# Patient Record
Sex: Male | Born: 1980 | Race: White | Hispanic: No | Marital: Married | Smoking: Former smoker
Health system: Southern US, Community
[De-identification: ages and names within clinical notes are randomized; demographics above are authoritative.]

## PROBLEM LIST (undated history)

## (undated) DIAGNOSIS — M545 Low back pain, unspecified: Secondary | ICD-10-CM

## (undated) DIAGNOSIS — T07XXXA Unspecified multiple injuries, initial encounter: Secondary | ICD-10-CM

## (undated) DIAGNOSIS — Z8489 Family history of other specified conditions: Secondary | ICD-10-CM

## (undated) DIAGNOSIS — Z98811 Dental restoration status: Secondary | ICD-10-CM

## (undated) DIAGNOSIS — N939 Abnormal uterine and vaginal bleeding, unspecified: Secondary | ICD-10-CM

## (undated) DIAGNOSIS — J45909 Unspecified asthma, uncomplicated: Secondary | ICD-10-CM

## (undated) DIAGNOSIS — F419 Anxiety disorder, unspecified: Secondary | ICD-10-CM

## (undated) DIAGNOSIS — G4730 Sleep apnea, unspecified: Secondary | ICD-10-CM

## (undated) DIAGNOSIS — G43909 Migraine, unspecified, not intractable, without status migrainosus: Secondary | ICD-10-CM

## (undated) DIAGNOSIS — G8929 Other chronic pain: Secondary | ICD-10-CM

## (undated) DIAGNOSIS — R55 Syncope and collapse: Secondary | ICD-10-CM

## (undated) DIAGNOSIS — S52501A Unspecified fracture of the lower end of right radius, initial encounter for closed fracture: Secondary | ICD-10-CM

## (undated) HISTORY — PX: TONSILLECTOMY: SHX5217

## (undated) HISTORY — DX: Low back pain, unspecified: M54.50

## (undated) HISTORY — PX: TONSILLECTOMY: SUR1361

## (undated) HISTORY — DX: Syncope and collapse: R55

## (undated) HISTORY — DX: Anxiety disorder, unspecified: F41.9

## (undated) HISTORY — DX: Unspecified asthma, uncomplicated: J45.909

## (undated) HISTORY — DX: Other chronic pain: G89.29

---

## 2008-12-11 ENCOUNTER — Other Ambulatory Visit: Admission: RE | Admit: 2008-12-11 | Discharge: 2008-12-11 | Payer: Self-pay | Admitting: Family Medicine

## 2009-04-21 ENCOUNTER — Encounter (INDEPENDENT_AMBULATORY_CARE_PROVIDER_SITE_OTHER): Payer: Self-pay | Admitting: General Surgery

## 2009-04-21 ENCOUNTER — Inpatient Hospital Stay (HOSPITAL_COMMUNITY): Admission: EM | Admit: 2009-04-21 | Discharge: 2009-04-22 | Payer: Self-pay | Admitting: Emergency Medicine

## 2009-04-21 HISTORY — PX: LAPAROSCOPIC APPENDECTOMY: SHX408

## 2010-08-18 ENCOUNTER — Emergency Department (HOSPITAL_COMMUNITY): Admission: EM | Admit: 2010-08-18 | Discharge: 2010-08-18 | Payer: Self-pay | Admitting: Emergency Medicine

## 2011-02-27 LAB — URINALYSIS, ROUTINE W REFLEX MICROSCOPIC
Glucose, UA: NEGATIVE mg/dL
Hgb urine dipstick: NEGATIVE
Ketones, ur: NEGATIVE mg/dL
Protein, ur: NEGATIVE mg/dL
Urobilinogen, UA: 0.2 mg/dL (ref 0.0–1.0)

## 2011-02-27 LAB — DIFFERENTIAL
Basophils Absolute: 0 10*3/uL (ref 0.0–0.1)
Basophils Relative: 0 % (ref 0–1)
Eosinophils Absolute: 0.2 10*3/uL (ref 0.0–0.7)
Monocytes Absolute: 0.5 10*3/uL (ref 0.1–1.0)
Monocytes Relative: 3 % (ref 3–12)
Neutrophils Relative %: 86 % — ABNORMAL HIGH (ref 43–77)

## 2011-02-27 LAB — CBC
HCT: 40.2 % (ref 36.0–46.0)
Hemoglobin: 13.3 g/dL (ref 12.0–15.0)
RBC: 4.76 MIL/uL (ref 3.87–5.11)
RDW: 12.3 % (ref 11.5–15.5)
WBC: 14.5 10*3/uL — ABNORMAL HIGH (ref 4.0–10.5)

## 2011-02-27 LAB — COMPREHENSIVE METABOLIC PANEL
ALT: 23 U/L (ref 0–35)
Alkaline Phosphatase: 71 U/L (ref 39–117)
BUN: 6 mg/dL (ref 6–23)
Chloride: 102 mEq/L (ref 96–112)
Glucose, Bld: 97 mg/dL (ref 70–99)
Potassium: 3.9 mEq/L (ref 3.5–5.1)
Sodium: 137 mEq/L (ref 135–145)
Total Bilirubin: 0.6 mg/dL (ref 0.3–1.2)
Total Protein: 7.1 g/dL (ref 6.0–8.3)

## 2011-02-27 LAB — POCT PREGNANCY, URINE: Preg Test, Ur: NEGATIVE

## 2011-04-04 NOTE — H&P (Signed)
[name], [name] NO.:  [id number]   MEDICAL RECORD NO.:  [id number]          PATIENT TYPE:  INP   LOCATION:  0098                         FACILITY:  [facility]   PHYSICIAN:  [name], M.D.DATE OF BIRTH:  04-26-1981   DATE OF ADMISSION:  04/20/2009  DATE OF DISCHARGE:                              HISTORY & PHYSICAL   CHIEF COMPLAINT:  Right lower quadrant pain and nausea.   HISTORY OF PRESENT ILLNESS:  This is a 30 year old Caucasian male who  states that for the past 2 weeks she has had mild intermittent episodes  right lower quadrant pain and nausea.  This has been clearly  intermittent and she felt normal in between.  She has seen no blood in  her stools.  She has had no fever or chills.  Starting at noon  yesterday, the pain in the right lower quadrant became steady and severe  and has been associated with nausea.  She has never had a pain like this  before.  She denies voiding problems.  She denies any history of  gastrointestinal disorders, although she states that several years ago  she had a CT scan in [place] and was told she had  diverticulitis.  She has had no followup.   She came to the emergency department here.  CT scan shows early  appendicitis in that the appendix is seen and is thought to be a little  bit swollen and inflamed.  There is no sign of abscess, rupture or other  focal abnormality.  I was called at that point.   PAST HISTORY:  1. Mild asthma.  2. Tonsillectomy in the past.  3. Migraine headaches.  4. Question of diverticulitis on CT by history several years ago.   CURRENT MEDICATIONS:  Albuterol inhaler rarely.  Midrin.  Yaz.   ALLERGIES:  Z-PAK and LATEX.   SOCIAL HISTORY:  She is single.  She has a partner, who is here with  her.  She smokes a half-pack of cigarettes per day.  Drinks alcohol  occasionally, perhaps once or twice weekly.  She works as a International aid/development worker for the [employer]   FAMILY HISTORY:  Mother living and well.  Father living, has had a  stroke and has hyperlipidemia.   REVIEW OF SYSTEMS:  A 10-system review of systems is performed and is  noncontributory except as described above.   PHYSICAL EXAMINATION:  A pleasant, cooperative, overweight Caucasian  male in mild distress.  Temperature 98.1, pulse 87, respirations 21, blood pressure 124/69.  Eyes:  Sclerae are clear.  Extraocular movements intact.  Ears, nose,  mouth, throat, lips, tongue and oropharynx are without gross lesions.  NECK:  Supple, nontender.  No mass.  No jugular venous distention.  LUNGS:  Clear to auscultation.  No chest wall tenderness.  HEART:  Regular rate and rhythm.  No murmur.  Radial and femoral pulses  are palpable.  BREASTS:  Not examined.  ABDOMEN:  Somewhat obese, soft, tender with involuntary guarding in the  right lower quadrant.  She has mild left lower quadrant tenderness and a  little bit of referred rebound as well.  There is no mass.  There is no  hernia.  Liver and spleen are not enlarged.  I do not see any scars.  GENITOURINARY:  There is no inguinal hernia or mass.  EXTREMITIES:  She moves all 4 extremities well without pain or  deformity.  NEUROLOGIC:  No gross motor or sensory deficits.   ADMISSION DATA:  CT scan suggesting early appendicitis described above.  Hemoglobin 13.3, white blood cell count 14,500.  Urine pregnancy test  negative.  Complete metabolic panel normal.   IMPRESSION:  1. Acute appendicitis.  No evidence of rupture or abscess.  2. Obesity.  3. Mild asthma.   PLAN:  1. The patient will be started on IV antibiotics, IV fluid      resuscitation, and will be taken to operating room for a diagnostic      laparoscopy and appendectomy.  2. I have discussed the indication and details of surgery with the      patient and her partner.  Risks and complications have been      outlined, including but not limited to bleeding,  infection,      conversion to open laparotomy, alternative diagnosis such as Crohn      disease or diverticulitis or gynecologic problems, injury to      adjacent organs such as the intestine or bladder with major      reconstructive surgery, wound problems, cardiac, pulmonary and      thromboembolic problems.  She seems to understand all these issues      well.  At this time all of their questions are answered.  She is in      full agreement this plan.      [name], M.D.  Electronically Signed     HMI/MEDQ  D:  04/21/2009  T:  04/21/2009  Job:  [id number]   cc:   Deboraha Sprang at [facility]

## 2011-04-04 NOTE — Op Note (Signed)
NAMEHANAN, MOEN NO.:  [id number]   MEDICAL RECORD NO.:  [id number]          PATIENT TYPE:  INP   LOCATION:  0098                         FACILITY:  [facility]   PHYSICIAN:  Angelia Mould. Derrell Lolling, M.D.DATE OF BIRTH:  1981-05-20   DATE OF PROCEDURE:  04/21/2009  DATE OF DISCHARGE:                               OPERATIVE REPORT   PREOPERATIVE DIAGNOSIS:  Acute appendicitis.   POSTOPERATIVE DIAGNOSIS:  Acute appendicitis.   OPERATION PERFORMED:  Laparoscopic appendectomy.   SURGEON:  Dr. Claud Kelp.   OPERATIVE INDICATIONS:  This is a 30 year old overweight Caucasian  male who presents with a 2-week history of mild to intermittent right  lower quadrant pain and diarrhea.  This has been not very dramatic.  She  notes for the past 24 hours or less more severe steady, sharp, right  lower quadrant pain and nausea.  She came to the emergency room.  Blood  work showed white blood cell count 14,500.  Physical exam shows  localized tenderness and guarding in the right lower quadrant and some  referred rebound the right lower quadrant.  A CT scan shows early  appendicitis and no other abnormalities.  She is brought to operating  room emergently.   OPERATIVE FINDINGS:  The appendix was acutely inflamed but there was no  exudate there was no gangrene.  There was no evidence of rupture.  The  last 3 feet of terminal ileum looked normal.  The right colon looked  normal.  The liver looked normal.  No other abnormalities were noted.   OPERATIVE TECHNIQUE:  Following induction of general endotracheal  anesthesia, a Foley catheter was inserted.  The patient's abdomen was  prepped and draped in sterile fashion.  Intravenous antibiotics were  given.  Surgical time-out was held.  The patient was identified as  correct patient and correct procedure.  0.5% Marcaine with epinephrine  was used as a local infiltration anesthetic.  A vertically oriented  incision was made just above  the umbilicus.  The fascia was incised in  the midline.  The abdominal cavity entered under direct vision.  An 11-  mm Hassan trocar was inserted, secured the pursestring suture of 0  Vicryl.  Pneumoperitoneum was created.  Video cam was inserted with  visualization and findings as described above.  A 5-mm trocar was placed  in the left lower mid abdomen and a 12-mm trocar was placed in the  suprapubic area just to the left in the midline.  We positioned the  patient and mobilized the small bowel up out of the pelvis.  We  identified the anatomy of the ileocecal valve, the appendix and cecum.  We elevated the appendix with a grasper.  Using the Harmonic scalpel.  We divided and took down the appendiceal mesentery and the appendiceal  artery.  We did this in several small steps until we had completely  skeletonized the appendix and could clearly see the junction of the  appendix with the cecum.  An Endo-GIA stapler was inserted and placed  transversely across the base of the appendix at the level of the cecum.  Stapler  was closed, held in place for 30 seconds, fired and removed.  Staple line looked very good, no bleeding, very secure closure.  The  appendix was placed in the specimen bag and removed.  The operative  field was irrigated.  There was no real bleeding or exudate.  We removed  the trocars.  There was no bleeding from trocar sites.  The  pneumoperitoneum was released.  The fascia at the umbilicus was closed  with 0 Vicryl sutures.  All the incisions were irrigated with saline and  all three skin incisions closed with subcuticular sutures of 4-0  Monocryl and Steri-Strips.  Clean bandages were placed and the patient  taken to recovery room in stable condition.  Estimated blood loss was  about 10 mL.   COMPLICATIONS:  None.   Sponge, needle and instrument counts were correct.      Angelia Mould. Derrell Lolling, M.D.  Electronically Signed     HMI/MEDQ  D:  04/21/2009  T:   04/21/2009  Job:  [id number]   cc:   Deboraha Sprang at [facility]

## 2012-04-25 ENCOUNTER — Ambulatory Visit (INDEPENDENT_AMBULATORY_CARE_PROVIDER_SITE_OTHER): Payer: BC Managed Care – PPO | Admitting: Surgery

## 2012-04-25 ENCOUNTER — Encounter (INDEPENDENT_AMBULATORY_CARE_PROVIDER_SITE_OTHER): Payer: Self-pay | Admitting: Surgery

## 2012-04-25 VITALS — BP 122/84 | HR 72 | Temp 98.3°F | Resp 14 | Ht 67.0 in | Wt 251.6 lb

## 2012-04-25 DIAGNOSIS — E669 Obesity, unspecified: Secondary | ICD-10-CM | POA: Insufficient documentation

## 2012-04-25 DIAGNOSIS — K8010 Calculus of gallbladder with chronic cholecystitis without obstruction: Secondary | ICD-10-CM | POA: Insufficient documentation

## 2012-04-25 NOTE — Progress Notes (Signed)
Subjective:     Patient ID: [name], male   DOB: Jul 11, 1981, 31 y.o.   MRN: [id number]  HPI  [name]  11-23-1980 [id number]  Patient Care Team: Leanor Rubenstein, MD as PCP - General (Family Medicine)  This patient is a 31 y.o.male who presents today for surgical evaluation at the request of Dr. Wynelle Link.   Reason for visit: Chest and upper abdominal pain. Gallstones. Question biliary etiology.  This pleasant obese male who has been struggling with intermittent abdominal pains for several months. He became more intense 2 months ago. Started having tightness and nausea. She has severe episode driving back from [place]. Unresponsive to inhalers. He usually associated after eating one or 2 hours later. Gets very queasy. Some bloating. Episodes are now happening almost every other day.  Moderate physical tolerance. Works as a [employer] fast streamer. He normally has daily bowel movements but they've become more loose. Has some chronic lactose intolerance but that is not changed. Denies heartburn or reflux. Can lie down flat/bend over without symptoms/difficulty. Some chronic pain issues but is stable.  She had workup which showed gallstones. Given the story, she was sent for evaluation for possibility of cholecystectomy  Patient Active Problem List  Diagnoses  . Chronic cholecystitis with calculus  . Obesity (BMI 30-39.9)    Past Medical History  Diagnosis Date  . Asthma   . Chronic cholecystitis with calculus 04/25/2012    Past Surgical History  Procedure Date  . Tonsillectomy   . Appendectomy 2010    History   Social History  . Marital Status: Single    Spouse Name: N/A    Number of Children: N/A  . Years of Education: N/A   Occupational History  . Not on file.   Social History Main Topics  . Smoking status: Former Smoker    Quit date: 10/20/2010  . Smokeless tobacco: Not on file  . Alcohol Use: Yes  . Drug Use: No  . Sexually Active: Not on file   Other  Topics Concern  . Not on file   Social History Narrative  . No narrative on file    Family History  Problem Relation Age of Onset  . Skin cancer      Current Outpatient Prescriptions  Medication Sig Dispense Refill  . albuterol (PROVENTIL) (2.5 MG/3ML) 0.083% nebulizer solution Take 2.5 mg by nebulization every 6 (six) hours as needed.      . BuPROPion HCl (WELLBUTRIN PO) Take 10 mg by mouth daily.      . cetirizine (ZYRTEC ALLERGY) 10 MG tablet Take 10 mg by mouth daily.      . promethazine (PHENERGAN) 12.5 MG tablet Take 12.5 mg by mouth every 6 (six) hours as needed.      . pseudoephedrine (SUDAFED) 120 MG 12 hr tablet Take 120 mg by mouth every 12 (twelve) hours.      . topiramate (TOPAMAX) 50 MG tablet Take 50 mg by mouth daily.         Allergies  Allergen Reactions  . Latex   . Zithromax (Azithromycin)     BP 122/84  Pulse 72  Temp 98.3 F (36.8 C)  Resp 14  Ht 5\' 7"  (1.702 m)  Wt 251 lb 9.6 oz (114.125 kg)  BMI 39.41 kg/m2  No results found.   Review of Systems  Constitutional: Negative for fever, chills, diaphoresis, appetite change and fatigue.  HENT: Negative for ear pain, sore throat, trouble swallowing, neck pain and ear discharge.  Eyes: Negative for photophobia, discharge and visual disturbance.  Respiratory: Negative for cough, choking, chest tightness and shortness of breath.   Cardiovascular: Negative for chest pain and palpitations.       Patient walks 30 minutes without difficulty.  No exertional chest/neck/shoulder/arm pain.   Gastrointestinal: Positive for nausea, abdominal pain and diarrhea. Negative for vomiting, constipation, anal bleeding and rectal pain.       No personal nor family history of GI/colon cancer, inflammatory bowel disease, irritable bowel syndrome, allergy such as Celiac Sprue, dietary problems except mild lactose intolerance, colitis, ulcers nor gastritis.    No recent sick contacts/gastroenteritis.  No travel outside the  country.  No changes in diet.   Genitourinary: Negative for dysuria, frequency and difficulty urinating.  Musculoskeletal: Negative for myalgias and gait problem.  Skin: Negative for color change, pallor and rash.  Neurological: Negative for dizziness, speech difficulty, weakness and numbness.  Hematological: Negative for adenopathy.  Psychiatric/Behavioral: Negative for confusion and agitation. The patient is not nervous/anxious.        Objective:   Physical Exam  Constitutional: She is oriented to person, place, and time. She appears well-developed and well-nourished. No distress.  HENT:  Head: Normocephalic.  Mouth/Throat: Oropharynx is clear and moist. No oropharyngeal exudate.  Eyes: Conjunctivae and EOM are normal. Pupils are equal, round, and reactive to light. No scleral icterus.  Neck: Normal range of motion. Neck supple. No tracheal deviation present.  Cardiovascular: Normal rate, regular rhythm and intact distal pulses.   Pulmonary/Chest: Effort normal and breath sounds normal. No respiratory distress. She exhibits no tenderness.  Abdominal: Soft. Bowel sounds are normal. She exhibits no distension, no ascites and no mass. There is tenderness in the right upper quadrant. There is no CVA tenderness. No hernia. Hernia confirmed negative in the right inguinal area and confirmed negative in the left inguinal area.       Obese  Genitourinary: No vaginal discharge found.  Musculoskeletal: Normal range of motion. She exhibits no tenderness.  Lymphadenopathy:    She has no cervical adenopathy.       Right: No inguinal adenopathy present.       Left: No inguinal adenopathy present.  Neurological: She is alert and oriented to person, place, and time. No cranial nerve deficit. She exhibits normal muscle tone. Coordination normal.  Skin: Skin is warm and dry. No rash noted. She is not diaphoretic. No erythema.  Psychiatric: She has a normal mood and affect. Her speech is normal and  behavior is normal. Judgment and thought content normal.       Pleasant, good sense of humor       Assessment:     Chronic cholecystitis    Plan:     I think she would benefit from lap chole.  OK to try single site  The anatomy & physiology of hepatobiliary & pancreatic function was discussed.  The pathophysiology of gallbladder dysfunction was discussed.  Natural history risks without surgery was discussed.   I feel the risks of no intervention will lead to serious problems that outweigh the operative risks; therefore, I recommended cholecystectomy to remove the pathology.  I explained laparoscopic techniques with possible need for an open approach.  Probable cholangiogram to evaluate the bilary tract was explained as well.    Risks such as bleeding, infection, abscess, leak, injury to other organs, need for further treatment, heart attack, death, and other risks were discussed.  I noted a good likelihood this will help address the problem.  Possibility that this will not correct all abdominal symptoms was explained.  Goals of post-operative recovery were discussed as well.  We will work to minimize complications.  An educational handout further explaining the pathology and treatment options was given as well.  Questions were answered.  The patient expresses understanding & wishes to proceed with surgery.

## 2012-04-25 NOTE — Patient Instructions (Signed)
See the Handout(s) we gave you.  Consider surgery.  Please call our office at [phone] if you wish to schedule surgery or if you have further questions / concerns.   Cholelithiasis Cholelithiasis (also called gallstones) is a form of gallbladder disease where gallstones form in your gallbladder. The gallbladder is a non-essential organ that stores bile made in the liver, which helps digest fats. Gallstones begin as small crystals and slowly grow into stones. Gallstone pain occurs when the gallbladder spasms, and a gallstone is blocking the duct. Pain can also occur when a stone passes out of the duct.  Women are more likely to develop gallstones than men. Other factors that increase the risk of gallbladder disease are:  Having multiple pregnancies. Physicians sometimes advise removing diseased gallbladders before future pregnancies.   Obesity.   Diets heavy in fried foods and fat.   Increasing age (older than 60).   Prolonged use of medications containing male hormones.   Diabetes mellitus.   Rapid weight loss.   Family history of gallstones (heredity).  SYMPTOMS  Feeling sick to your stomach (nauseous).   Abdominal pain.   Yellowing of the skin (jaundice).   Sudden pain. It may persist from several minutes to several hours.   Worsening pain with deep breathing or when jarred.   Fever.   Tenderness to the touch.  In some cases, when gallstones do not move into the bile duct, people have no pain or symptoms. These are called "silent" gallstones. TREATMENT In severe cases, emergency surgery may be required. HOME CARE INSTRUCTIONS   Only take over-the-counter or prescription medicines for pain, discomfort, or fever as directed by your caregiver.   Follow a low-fat diet until seen again. Fat causes the gallbladder to contract, which can result in pain.   Follow up as instructed. Attacks are almost always recurrent and surgery is usually required for permanent  treatment.  SEEK IMMEDIATE MEDICAL CARE IF:   Your pain increases and is not controlled by medications.   You have an oral temperature above 102 F (38.9 C), not controlled by medication.   You develop nausea and vomiting.  MAKE SURE YOU:   Understand these instructions.   Will watch your condition.   Will get help right away if you are not doing well or get worse.  Document Released: 11/02/2005 Document Revised: 10/26/2011 Document Reviewed: 01/05/2011 [employer] Patient Information 2012 [employer]. 

## 2012-06-04 ENCOUNTER — Encounter (INDEPENDENT_AMBULATORY_CARE_PROVIDER_SITE_OTHER): Payer: Self-pay

## 2012-06-07 ENCOUNTER — Encounter (INDEPENDENT_AMBULATORY_CARE_PROVIDER_SITE_OTHER): Payer: Self-pay

## 2012-06-18 ENCOUNTER — Other Ambulatory Visit (INDEPENDENT_AMBULATORY_CARE_PROVIDER_SITE_OTHER): Payer: Self-pay | Admitting: Surgery

## 2012-06-18 ENCOUNTER — Telehealth (INDEPENDENT_AMBULATORY_CARE_PROVIDER_SITE_OTHER): Payer: Self-pay

## 2012-06-18 DIAGNOSIS — K8010 Calculus of gallbladder with chronic cholecystitis without obstruction: Secondary | ICD-10-CM

## 2012-06-18 HISTORY — PX: CHOLECYSTECTOMY: SHX55

## 2012-06-18 NOTE — Telephone Encounter (Signed)
LMOM for pt to call me back b/c I need to work her into see Dr Michaell Cowing on his clinic for 7/31 b/c we are canceling Dr Gordy Savers office for Monday the 8/5.

## 2012-06-24 ENCOUNTER — Encounter (INDEPENDENT_AMBULATORY_CARE_PROVIDER_SITE_OTHER): Payer: BC Managed Care – PPO | Admitting: Surgery

## 2012-06-26 ENCOUNTER — Encounter (INDEPENDENT_AMBULATORY_CARE_PROVIDER_SITE_OTHER): Payer: BC Managed Care – PPO | Admitting: Surgery

## 2012-07-01 ENCOUNTER — Ambulatory Visit (INDEPENDENT_AMBULATORY_CARE_PROVIDER_SITE_OTHER): Payer: BC Managed Care – PPO | Admitting: Surgery

## 2012-07-01 ENCOUNTER — Encounter (INDEPENDENT_AMBULATORY_CARE_PROVIDER_SITE_OTHER): Payer: Self-pay | Admitting: Surgery

## 2012-07-01 VITALS — BP 120/78 | HR 70 | Temp 97.3°F | Resp 14 | Ht 67.0 in | Wt 248.2 lb

## 2012-07-01 DIAGNOSIS — K8010 Calculus of gallbladder with chronic cholecystitis without obstruction: Secondary | ICD-10-CM

## 2012-07-01 NOTE — Patient Instructions (Signed)

## 2012-07-01 NOTE — Progress Notes (Signed)
Subjective:     Patient ID: [name], male   DOB: 10/11/1981, 31 y.o.   MRN: [id number]  HPI  [name]  11/26/1980 [id number]  Patient Care Team: Leanor Rubenstein, MD as PCP - General (Family Medicine)  This patient is a 31 y.o.male who presents today for surgical evaluation.   Procedure: Single site laparoscopic cholecystectomy 06/18/2012  Pathology: Chronic cholecystitis  The patient comes in today feeling better.  Off narcotics.  Some early satiety but tolerating most foods.  No diarrhea.  Energy level coming back.  Wanting to get back to work.  Doing moderate activity all ready.  No fevers or chills.  No nausea or vomiting daily bowel movements no severe diarrhea  Patient Active Problem List  Diagnosis  . Chronic cholecystitis with calculus  . Obesity (BMI 30-39.9)    Past Medical History  Diagnosis Date  . Asthma   . Chronic cholecystitis with calculus 04/25/2012    Past Surgical History  Procedure Date  . Tonsillectomy   . Appendectomy 2010  . Cholecystectomy 06/18/12    History   Social History  . Marital Status: Single    Spouse Name: N/A    Number of Children: N/A  . Years of Education: N/A   Occupational History  . Not on file.   Social History Main Topics  . Smoking status: Former Smoker    Quit date: 10/20/2010  . Smokeless tobacco: Not on file  . Alcohol Use: Yes  . Drug Use: No  . Sexually Active: Not on file   Other Topics Concern  . Not on file   Social History Narrative  . No narrative on file    Family History  Problem Relation Age of Onset  . Skin cancer      Current Outpatient Prescriptions  Medication Sig Dispense Refill  . albuterol (PROVENTIL) (2.5 MG/3ML) 0.083% nebulizer solution Take 2.5 mg by nebulization every 6 (six) hours as needed.      . BuPROPion HCl (WELLBUTRIN PO) Take 10 mg by mouth daily.      . cetirizine (ZYRTEC ALLERGY) 10 MG tablet Take 10 mg by mouth daily.      . promethazine  (PHENERGAN) 12.5 MG tablet Take 12.5 mg by mouth every 6 (six) hours as needed.      . pseudoephedrine (SUDAFED) 120 MG 12 hr tablet Take 120 mg by mouth every 12 (twelve) hours.      . topiramate (TOPAMAX) 50 MG tablet Take 50 mg by mouth daily.         Allergies  Allergen Reactions  . Latex   . Zithromax (Azithromycin)     BP 120/78  Pulse 70  Temp 97.3 F (36.3 C) (Temporal)  Resp 14  Ht 5\' 7"  (1.702 m)  Wt 248 lb 4 oz (112.605 kg)  BMI 38.88 kg/m2  No results found.   Review of Systems  Constitutional: Negative for fever, chills and diaphoresis.  HENT: Negative for ear pain, sore throat and trouble swallowing.   Eyes: Negative for photophobia and visual disturbance.  Respiratory: Negative for cough and choking.   Cardiovascular: Negative for chest pain and palpitations.  Gastrointestinal: Negative for nausea, vomiting, abdominal pain, diarrhea, constipation, anal bleeding and rectal pain.  Genitourinary: Negative for dysuria, frequency and difficulty urinating.  Musculoskeletal: Negative for myalgias and gait problem.  Skin: Negative for color change, pallor and rash.  Neurological: Negative for dizziness, speech difficulty, weakness and numbness.  Hematological: Negative for adenopathy.  Psychiatric/Behavioral:  Negative for confusion and agitation. The patient is not nervous/anxious.        Objective:   Physical Exam  Constitutional: She is oriented to person, place, and time. She appears well-developed and well-nourished. No distress.  HENT:  Head: Normocephalic.  Mouth/Throat: Oropharynx is clear and moist. No oropharyngeal exudate.  Eyes: Conjunctivae and EOM are normal. Pupils are equal, round, and reactive to light. No scleral icterus.  Neck: Normal range of motion. Neck supple. No tracheal deviation present.  Cardiovascular: Normal rate, regular rhythm and intact distal pulses.   Pulmonary/Chest: Effort normal and breath sounds normal. No respiratory  distress. She exhibits no tenderness.  Abdominal: Soft. She exhibits no distension and no mass. There is no tenderness. Hernia confirmed negative in the right inguinal area and confirmed negative in the left inguinal area.       Incisions clean with normal healing ridges. Mild separation of skin on right side at the most.   No hernias  Genitourinary: No vaginal discharge found.  Musculoskeletal: Normal range of motion. She exhibits no tenderness.  Lymphadenopathy:    She has no cervical adenopathy.       Right: No inguinal adenopathy present.       Left: No inguinal adenopathy present.  Neurological: She is alert and oriented to person, place, and time. No cranial nerve deficit. She exhibits normal muscle tone. Coordination normal.  Skin: Skin is warm and dry. No rash noted. She is not diaphoretic. No erythema.  Psychiatric: She has a normal mood and affect. Her behavior is normal. Judgment and thought content normal.       Assessment:     Recovering well only 2 weeks out from cholecystectomy for chronic cholecystitis    Plan:     Increase activity as tolerated.  Do not push through pain.  OK to go back to activity and then transition to unrestrained over the next week or so.  Forms filled out  Advance diet as tolerated. Bowel regimen to avoid problems.  Return to clinic p.r.n. The patient expressed understanding and appreciation

## 2014-11-17 ENCOUNTER — Encounter: Payer: Self-pay | Admitting: Skilled Nursing Facility1

## 2014-11-17 ENCOUNTER — Encounter: Payer: BC Managed Care – PPO | Attending: Family Medicine | Admitting: Skilled Nursing Facility1

## 2014-11-17 VITALS — Ht 67.0 in | Wt 277.0 lb

## 2014-11-17 DIAGNOSIS — Z6841 Body Mass Index (BMI) 40.0 and over, adult: Secondary | ICD-10-CM | POA: Diagnosis not present

## 2014-11-17 DIAGNOSIS — E669 Obesity, unspecified: Secondary | ICD-10-CM | POA: Insufficient documentation

## 2014-11-17 DIAGNOSIS — Z713 Dietary counseling and surveillance: Secondary | ICD-10-CM | POA: Diagnosis not present

## 2014-11-17 NOTE — Progress Notes (Signed)
  Medical Nutrition Therapy:  Appt start time: 11:15 end time:  12:15.   Assessment:  Primary concerns today: referred for obesity. Patient states she needs to change what and how she eats. She states that with a full time work schedule and a fulltime school schedule she has a hectic schedule which does not allow for exercise so she wants to lose weight without exercise. She came is worried the dietitian would "be a food nazis such as that of her wife". She did not want a restrictive diet. She states she is 100 pounds over where needs to be and feels her knee and back issues are due to excess weight. She reports her weight at 140 pounds in high school, gained weight in college and then out of college she was 280 pounds. When she was working in [employer] jail she got back to 180 pounds she left that job in 2008. 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 She finds her sleep to be sperotic and usually gets sleep an average of three hours a night. Her wife states the patient is a slow eater and she eats in her recliner at home in front of Television. The patient only has three classes left and will finish her education in mid august. She states her family is overweight on both sides of family. She seemed agitated/stressed as the appointment continued but she was still responsive to the dietitian. Her spouse seemed supportive but also hard on her wife when it came to her diet.      Preferred Learning Style:   Auditory  Visual  Learning Readiness:  Ready  MEDICATIONS: See List   DIETARY INTAKE:  Usual eating pattern includes 3 meals and none snacks per day.  Everyday foods include fast food.  Avoided foods include none stated.    24-hr recall:  B ( AM): cereal-raisin nut bran---none  Snk ( AM): none L ( PM): manhattan pizza and subs-sub with french fries Snk ( PM): none D ( PM): oranges and crackers-----fast food Snk ( PM):  none Beverages: soda, sweet tea with alternative sugar, water with flavoring  Usual physical activity: ADL's  Estimated energy needs: 1800 calories 200 g carbohydrates 135 g protein 50 g fat  Progress Towards Goal(s):  In progress.   Nutritional Diagnosis:  Willey-3.3 Overweight/obesity As related to overconsumption of fast food meals.  As evidenced by patient report.    Intervention:  Nutrition counseling for obesity. Dietitian educated the patient on the importance of eating more meals from home, proper portion sizes, and changing her relationship with food.  _Motivations to keep in mind and Barriers to keep in mind -Let your spouse help you plan out your meals   Goals:  Eat 3 meals/day, Avoid meal skipping   Increase protein rich foods  Follow "Plate Method" for portion control  Limit carbohydrate1-2 servings/meal   Choose more whole grains, lean protein, low-fat dairy, and fruits/non-starchy vegetables.   Aim for >30 min of physical activity daily  Limit sugar-sweetened beverages and concentrated sweets  Aim for 7 hours of sleep every night  Take study breaks  Teaching Method Utilized:  Visual Auditory  Handouts given during visit include:  Low sodium flavoring  My Plate  Barriers to learning/adherence to lifestyle change: a busy schedule.   Demonstrated degree of understanding via:  Teach Back   Monitoring/Evaluation:  Dietary intake and body weight prn.

## 2014-11-17 NOTE — Patient Instructions (Signed)
_Motivations to keep in mind and Barriers to keep in mind -Let your spouse help you plan out your meals   Goals:  Eat 3 meals/day, Avoid meal skipping   Increase protein rich foods  Follow "Plate Method" for portion control  Limit carbohydrate1-2 servings/meal   Choose more whole grains, lean protein, low-fat dairy, and fruits/non-starchy vegetables.   Aim for >30 min of physical activity daily  Limit sugar-sweetened beverages and concentrated sweets  Aim for 7 hours of sleep every night  Take study breaks

## 2015-02-19 ENCOUNTER — Ambulatory Visit: Payer: BC Managed Care – PPO | Admitting: Skilled Nursing Facility1

## 2015-11-21 HISTORY — PX: FOOT SURGERY: SHX648

## 2016-10-26 ENCOUNTER — Other Ambulatory Visit: Payer: Self-pay | Admitting: Family Medicine

## 2016-10-26 DIAGNOSIS — G43909 Migraine, unspecified, not intractable, without status migrainosus: Secondary | ICD-10-CM

## 2016-10-26 DIAGNOSIS — H538 Other visual disturbances: Secondary | ICD-10-CM

## 2016-11-03 ENCOUNTER — Ambulatory Visit
Admission: RE | Admit: 2016-11-03 | Discharge: 2016-11-03 | Disposition: A | Discharge status: Home / Self Care | Payer: BC Managed Care – PPO | Source: Ambulatory Visit | Attending: Family Medicine | Admitting: Family Medicine

## 2016-11-03 DIAGNOSIS — G43909 Migraine, unspecified, not intractable, without status migrainosus: Secondary | ICD-10-CM

## 2016-11-03 DIAGNOSIS — H538 Other visual disturbances: Secondary | ICD-10-CM

## 2017-04-12 IMAGING — CT CT HEAD W/O CM
3 series · 16 of 47 positions shown, 19 images · non-contrast
Comparison: None.

CLINICAL DATA: Blurry vision for 2 months

EXAM:
CT HEAD WITHOUT CONTRAST
TECHNIQUE: Contiguous axial images were obtained from the base of the skull
through the vertex without intravenous contrast.

[Series 2: head w/(date) · axial · 0.45mm/px · z∈[+23,+148]mm · 10 of 30 slices shown, 13 images]
[im 3/30  brain]
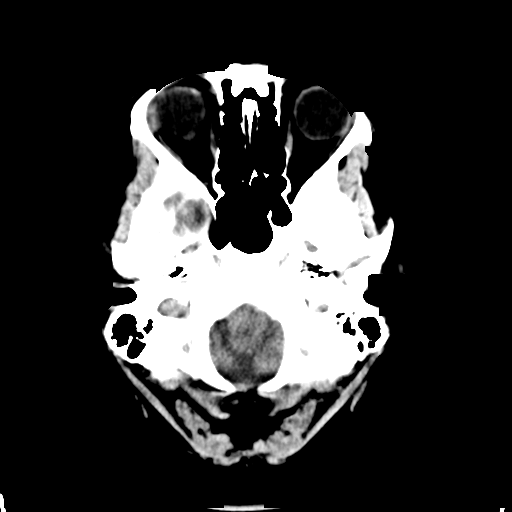
[im 3/30  bone]
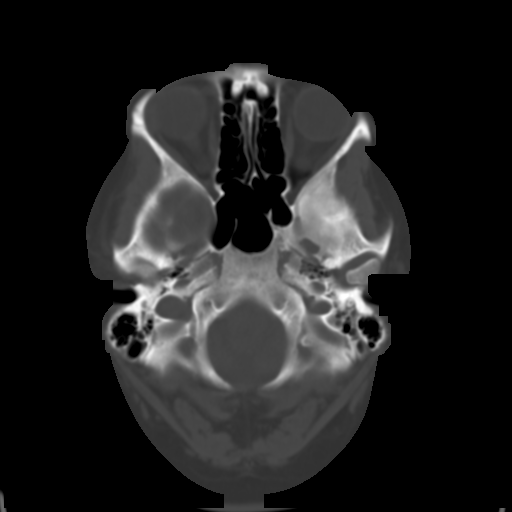
[im 6/30  brain]
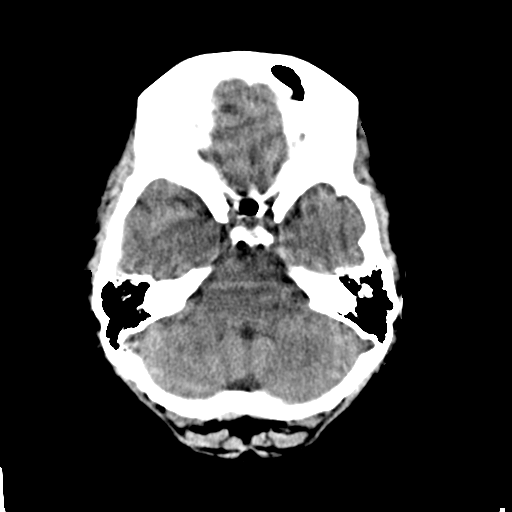
[im 9/30  brain]
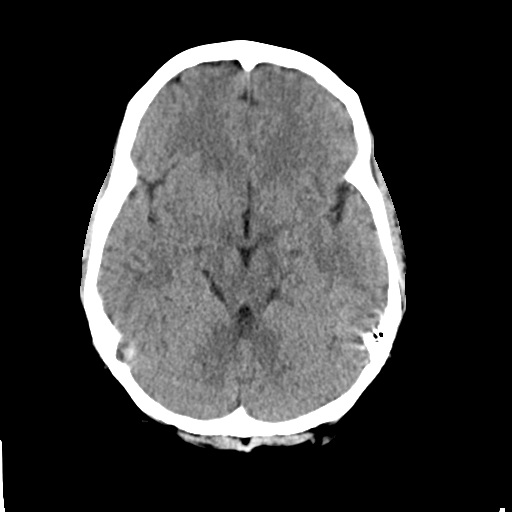
[im 11/30  brain]
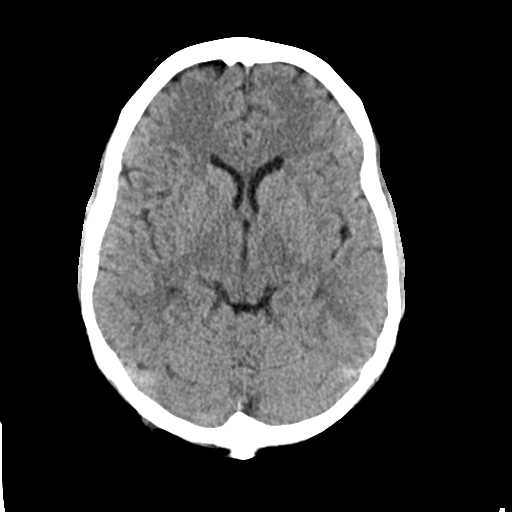
[im 14/30  brain]
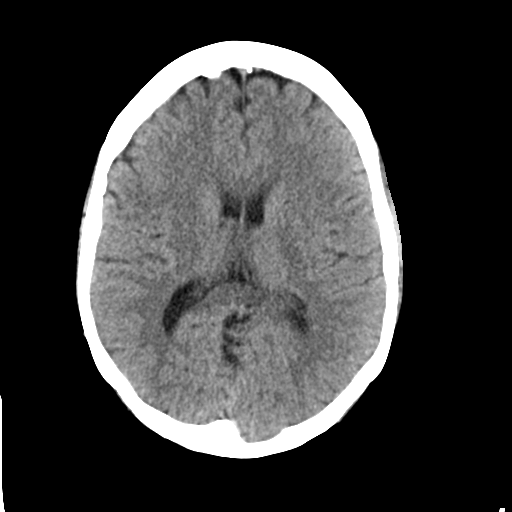
[im 14/30  bone]
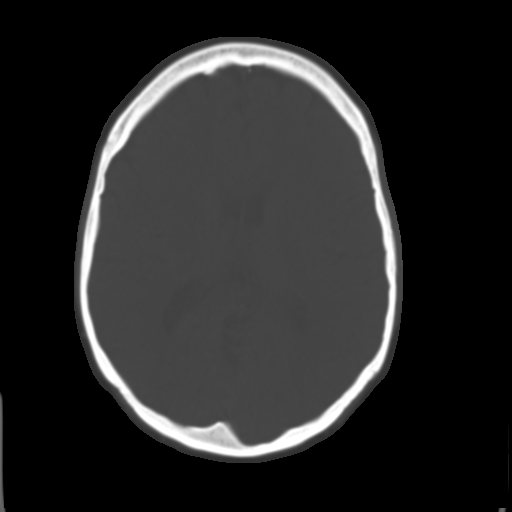
[im 17/30  brain]
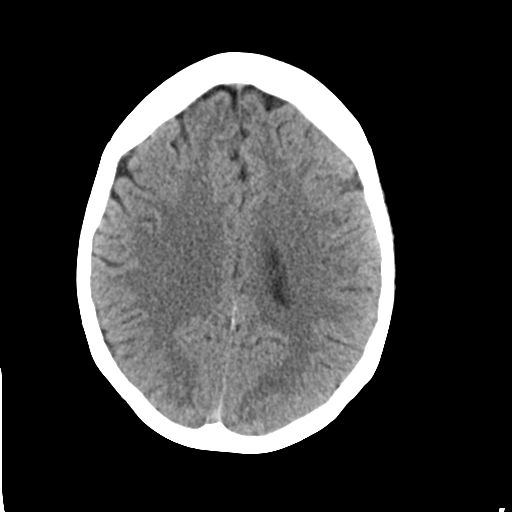
[im 20/30  brain]
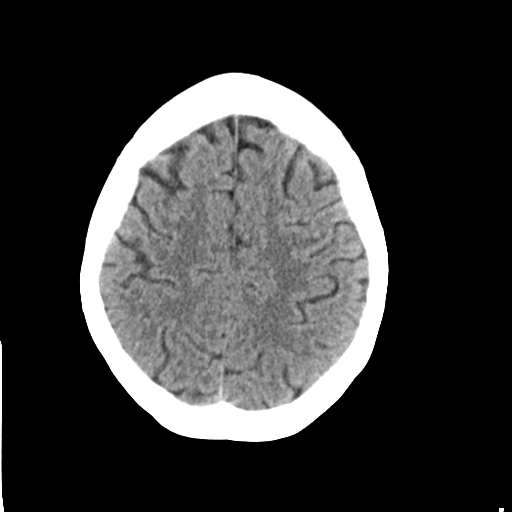
[im 23/30  brain]
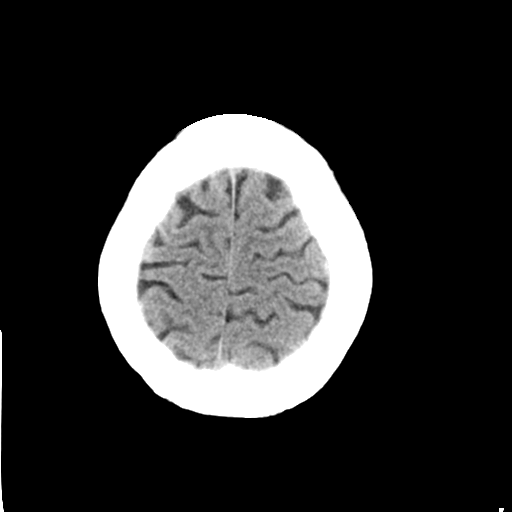
[im 25/30  brain]
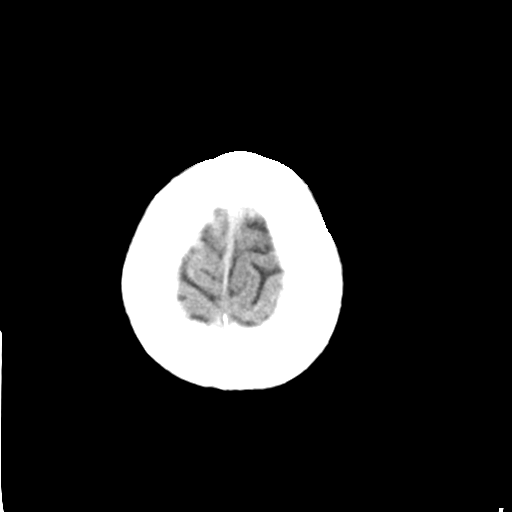
[im 25/30  bone]
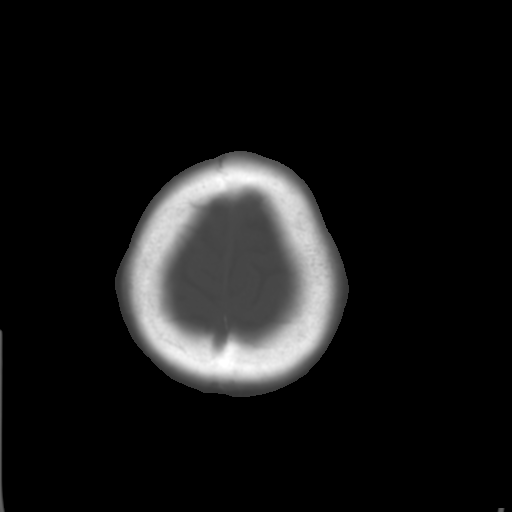
[im 28/30  brain]
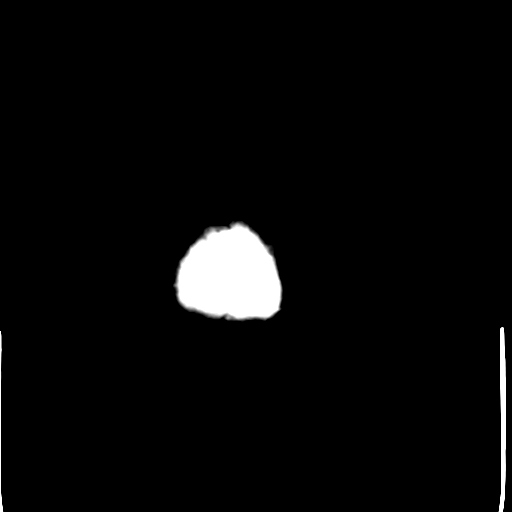

[Series 4: cor · coronal · 0.29mm/px · 3 of 65 slices shown]
[im 22/65  brain]
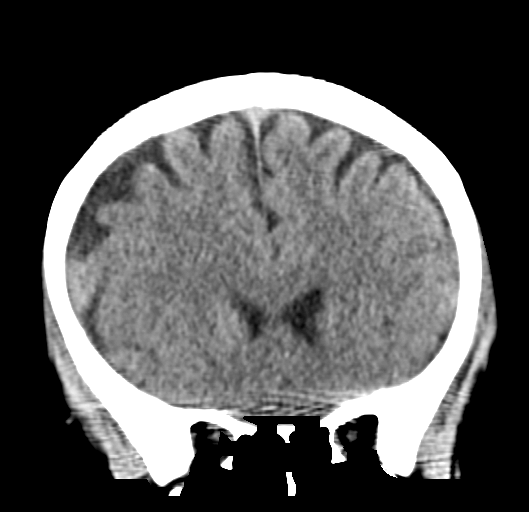
[im 29/65  brain]
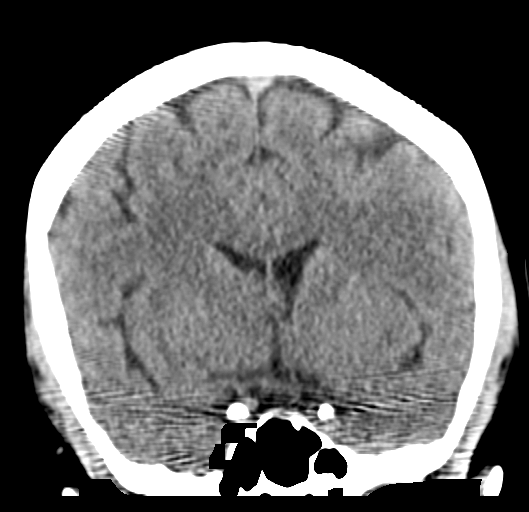
[im 36/65  brain]
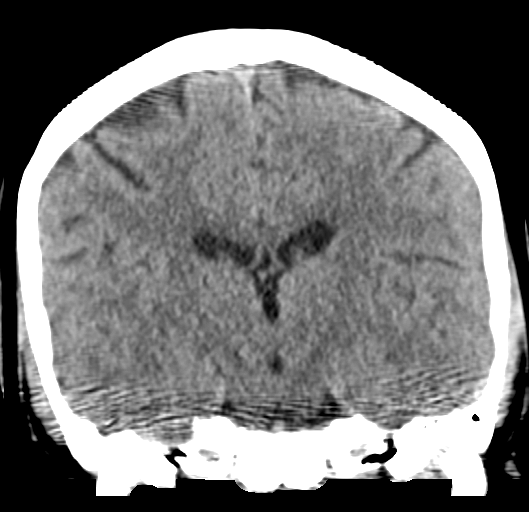

[Series 5: sag · sagittal · 0.29mm/px · 3 of 54 slices shown]
[im 18/54  brain]
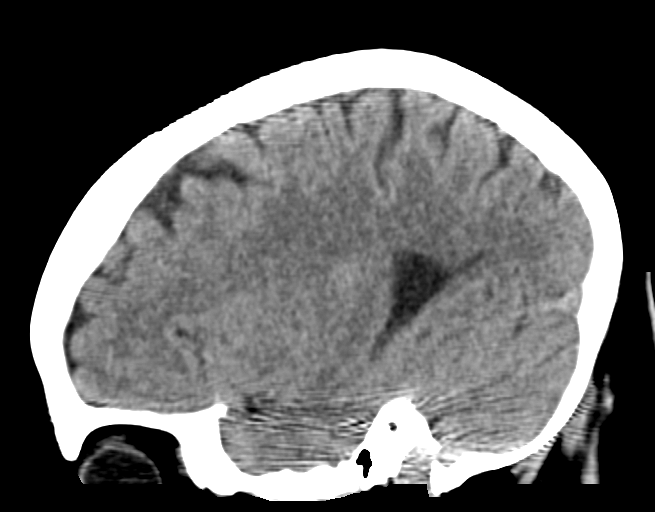
[im 27/54  brain]
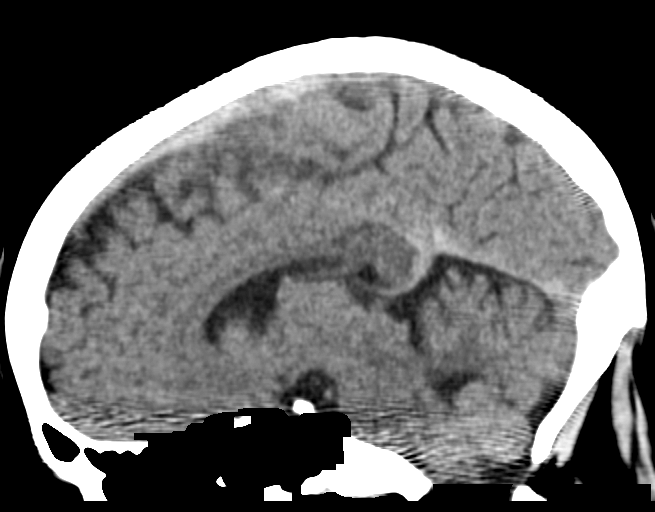
[im 36/54  brain]
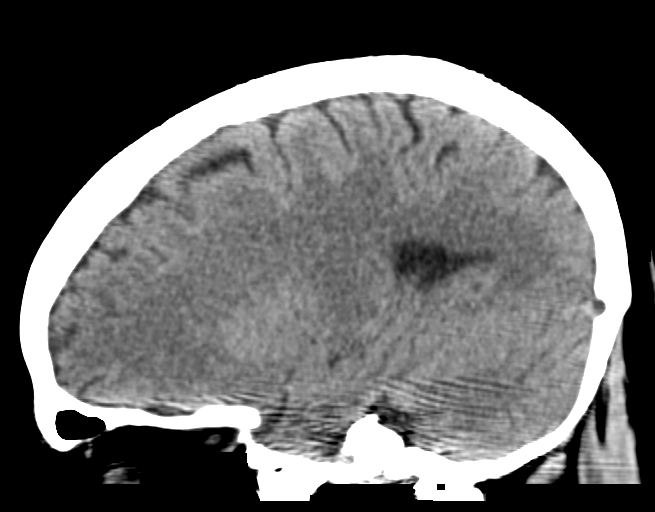

[16 of 47 positions shown; findings below may reference images not displayed]

FINDINGS: Brain: No evidence of acute infarction, hemorrhage, hydrocephalus,
extra-axial collection or mass lesion/mass effect.

Vascular: No hyperdense vessel or unexpected calcification.

Skull: Normal. Negative for fracture or focal lesion.

Sinuses/Orbits: No acute finding.

Other: Calcification is noted in the left frontal region on image
number 19 of series 2 consistent with a small meningioma.
IMPRESSION: No acute intracranial abnormality noted.

## 2017-08-27 ENCOUNTER — Ambulatory Visit: Payer: BC Managed Care – PPO | Attending: Family Medicine | Admitting: Physical Therapy

## 2017-08-27 DIAGNOSIS — R42 Dizziness and giddiness: Secondary | ICD-10-CM

## 2017-08-27 NOTE — Patient Instructions (Signed)
Gaze Stabilization: Tip Card  1.Target must remain in focus, not blurry, and appear stationary while head is in motion. 2.Perform exercises with small head movements (45 to either side of midline). 3.Increase speed of head motion so long as target is in focus. 4.If you wear eyeglasses, be sure you can see target through lens (therapist will give specific instructions for bifocal / progressive lenses). 5.These exercises may provoke dizziness or nausea. Work through these symptoms. If too dizzy, slow head movement slightly. Rest between each exercise. 6.Exercises demand concentration; avoid distractions. 7.For safety, perform standing exercises close to a counter, wall, corner, or next to someone.  Copyright  VHI. All rights reserved.  Gaze Stabilization: Standing Feet Apart    Feet shoulder width apart, keeping eyes on target on wall _6-8___ feet away, tilt head down 15-30 and move head side to side for _60___ seconds. Repeat while moving head up and down for _60___ seconds. Do _3-5___ sessions per day. Repeat using target on pattern background.  Copyright  VHI. All rights reserved.

## 2017-08-28 NOTE — Therapy (Signed)
[facility] [address] [place], [place], [address] Phone: [phone]   Fax:  [phone]  Physical Therapy Evaluation  Patient Details  Name: [name] MRN: [id number] Date of Birth: 06/13/1981 Referring Provider: Damaris Hippo, MD  Encounter Date: 08/27/2017      PT End of Session - 08/28/17 2027    Visit Number 1   Number of Visits 2   Authorization Type BCBS   PT Start Time 1316   PT Stop Time 1401   PT Time Calculation (min) 45 min      Past Medical History:  Diagnosis Date  . Asthma   . Chronic cholecystitis with calculus 04/25/2012    Past Surgical History:  Procedure Laterality Date  . APPENDECTOMY  2010  . CHOLECYSTECTOMY  06/18/12  . TONSILLECTOMY      There were no vitals filed for this visit.       Subjective Assessment - 08/28/17 2009    Subjective Pt states she does not feel dizzy but states she knows that her vision is not normal; pt reports having had a severe episode on Sat., 08-25-17 after attending a class during the day; pt reports she had 2 less severe episodes on Sat. prior to the major episode;  pt reports she had an eye exam and the eye doctor told her that it could possibly be MS                                                                                                                                                                                           Pertinent History history of migraines since 1997; not had migraine in 2 weeks; last severe migraine started on 9-18 and lasted until 08-10-17   Patient Stated Goals resolve the vertigo   Currently in Pain? No/denies            [facility] PT Assessment - 08/28/17 0001      Assessment   Medical Diagnosis BPPV -Bilateral   Referring Provider Damaris Hippo, MD   Onset Date/Surgical Date --  Feb. 2017 with most recent episode on 08-25-17     Balance Screen   Has the patient fallen in the past 6 months No   Has the  patient had a decrease in activity level because of a fear of falling?  No   Is the patient reluctant to leave their home because of a fear of falling?  No     Prior Function   Level of Independence Independent   Vocation Full time employment            Vestibular Assessment -  08/28/17 0001      Vestibular Assessment   General Observation Pt is a 36 yr old male with c/o dizziness and visual disturbance     Symptom Behavior   Type of Dizziness "Funny feeling in head"   Frequency of Dizziness varies - occurs spontaneously   Duration of Dizziness most recent episode on Saturday lasted 5 hours   Aggravating Factors No known aggravating factors   Relieving Factors Lying supine;Rest     Occulomotor Exam   Occulomotor Alignment Normal   Spontaneous Absent   Smooth Pursuits Intact   Comment pt reports she has occurrences of spontaneous nystagmus     Visual Acuity   Static line 10   Dynamic line 6  abnormal as > 2 line difference     Positional Testing   Dix-Hallpike Dix-Hallpike Right;Dix-Hallpike Left   Sidelying Test Sidelying Right;Sidelying Left     Dix-Hallpike Right   Dix-Hallpike Right Duration none   Dix-Hallpike Right Symptoms No nystagmus     Dix-Hallpike Left   Dix-Hallpike Left Duration none   Dix-Hallpike Left Symptoms No nystagmus     Sidelying Right   Sidelying Right Duration none     Sidelying Left   Sidelying Left Duration none        Objective measurements completed on examination: See above findings.                  PT Education - 08/28/17 2026    Education provided Yes   Education Details gaze stabilization   Person(s) Educated Patient   Methods Explanation;Demonstration;Handout   Comprehension Verbalized understanding;Returned demonstration             PT Long Term Goals - 08/28/17 2041      PT LONG TERM GOAL #1   Title Complete SOT to assess use of vestibular input in balance   Time 2   Period Weeks   Status  New   Target Date 09/07/17     PT LONG TERM GOAL #2   Title Independent in HEP for balance/vestibular exercises.   Time 2   Period Weeks   Status New   Target Date 09/07/17                Plan - 08/28/17 2029    Clinical Impression Statement Pt is a 36 yr old male with report of spontaneous episodes of vertigo and blurred vision since Feb. 2017.  Pt reports that episodes are progressively getting worse in frequency and intensity.  Pt has no signs consistemt with BPPV, which is the diagnosis on the referral.  Pt reports no vertigo at today's eavla and states that today is a good day.  No positional testing provoked vertigo and no nystagmus was noted in R nor L Dix-Hallpike positions.  Pt does have 4 line difference with dynamic visual acuity and states that it was difficulty to focus during the testing, but denied vertigo upon completion of test.  Pt would benefit from further diagnostic workup to determine etiology of vertigo and may benefit from neurology consult to assess any central origin etiology for reported symptoms.  History and Personal Factors relevant to plan of care: h/o migraines   Clinical Presentation Evolving   Clinical Presentation due to: unknown etiology of vertigo as pt's symtpoms are not consistent with BPPV at this time   Rehab Potential Fair   Clinical Impairments Affecting Rehab Potential unknown etiology of vertigo   PT Frequency 2x / week   PT Duration 2 weeks   PT Treatment/Interventions ADLs/Self Care Home Management;Neuromuscular re-education;Vestibular;Therapeutic activities;Therapeutic exercise;Balance training;Patient/family education    PT Next Visit Plan do SOT   PT Home Exercise Plan gaze stabilization   Recommended Other Services neurology consult to determine etiology of vertigo - some symptoms appear to be of central origin   Consulted and Agree with Plan of Care Patient      Patient will benefit from skilled therapeutic intervention in order to improve the following deficits and impairments:  Dizziness, Impaired vision/preception  Visit Diagnosis: Dizziness and giddiness - Plan: PT plan of care cert/re-cert     Problem List Patient Active Problem List   Diagnosis Date Noted  . Chronic cholecystitis with calculus 04/25/2012  . Obesity (BMI 30-39.9) 04/25/2012    Shanta Dorvil, [place], PT 08/28/2017, 8:47 PM  [facility] [address] [place], [place], [address] Phone: [phone]   Fax:  [phone]  Name: [name] MRN: [id number] Date of Birth: 1981-08-20

## 2017-08-31 ENCOUNTER — Ambulatory Visit: Payer: BC Managed Care – PPO | Admitting: Physical Therapy

## 2017-11-05 ENCOUNTER — Ambulatory Visit: Payer: BC Managed Care – PPO | Admitting: Neurology

## 2017-11-05 ENCOUNTER — Encounter: Payer: Self-pay | Admitting: Neurology

## 2017-11-05 VITALS — Ht 68.0 in | Wt 273.0 lb

## 2017-11-05 DIAGNOSIS — R0683 Snoring: Secondary | ICD-10-CM | POA: Diagnosis not present

## 2017-11-05 DIAGNOSIS — R51 Headache with orthostatic component, not elsewhere classified: Secondary | ICD-10-CM

## 2017-11-05 DIAGNOSIS — G8929 Other chronic pain: Secondary | ICD-10-CM

## 2017-11-05 DIAGNOSIS — R42 Dizziness and giddiness: Secondary | ICD-10-CM

## 2017-11-05 DIAGNOSIS — H539 Unspecified visual disturbance: Secondary | ICD-10-CM

## 2017-11-05 DIAGNOSIS — R519 Headache, unspecified: Secondary | ICD-10-CM

## 2017-11-05 DIAGNOSIS — G43009 Migraine without aura, not intractable, without status migrainosus: Secondary | ICD-10-CM | POA: Insufficient documentation

## 2017-11-05 NOTE — Progress Notes (Signed)
[facility]    Provider:  Dr Jaynee Eagles Referring Provider: Damaris Hippo, MD  Primary Care Physician:  Damaris Hippo, MD  CC:  vertigo  HPI:  [name] is a 36 y.o. male here as a referral from Dr. Nancy Fetter for vertigo.  Past medical history migraine, depression, anxiety.  Here with partner who provides much information. 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  Also past medical history of obesity, chronic low back pain, left foot pain, meningiomas and incidental finding on head CT in December 2017.  She is on sertraline, Wellbutrin, Zofran, promethazine and meclizine as well. She started having migraines at the age of 47. Vertigo started 2 years ago. She denies ever seeing ENT. She was driving and her eyes started "darting to the left". She is a Research officer, trade [employer]. In June of this year started becoming more common, worse with heat and exertion. Last 5-10 minutes. She feels like her eyes are darting, this is the worst feeling, always to the left, people can see it. Sometimes it is room spinning but really its the eyes darting. She saw her eye doctor. She has migraines. She has daily headaches, she has morning headaches. She snores and has morning headaches. Partner can hear her snoring through ear plugs and a fan, wake ups with headaches. Constantly rolling and kicking all night.  Migraines are behind the eyes, pulsating, throbbing, light and sound sensitivity, 2 days a month. No other focal neurologic deficits, associated symptoms, inciting events or modifiable factors.  Tried: Fioricet, zomig, imitrex (side effects), midrin, toradol, topamax, daith piercing. Propranolol.   Reviewed notes, labs and imaging from outside physicians, which showed:  Patient was diagnosed with vestibular migraines by ear nose and throat in 2016(patient denies).  It appears she is already been to vestibular therapy, episodes are getting more intense and more often, 3 episodes in 1 day  having to take Zofran and meclizine, she is gotten sick and vomited, she is on a scopolamine patch which is working, also vision changes and dizziness, the dizziness is described as spinning, worse when it is hot outside, better with laying down, can be positional however, stressors have been increasing, she also has a history of asthma.  Reviewed labs July 2018 which includes normal CBC, normal CMP with BUN 4 and creatinine 0.68, TSH was normal in 2015.  Review of Systems: Patient complains of symptoms per HPI as well as the following symptoms: Blurred vision, double vision, spinning sensation, confusion, headache, dizziness, insomnia, snoring, depression, anxiety. Pertinent negatives and positives per HPI. All others negative.   Social History   Socioeconomic History  . Marital status: Married    Spouse name: Not on file  . Number of children: 0  . Years of education: Not on file  . Highest education level: Master's degree (e.g., MA, MS, MEng, MEd, MSW, MBA)  Social Needs  . Financial resource strain: Not on file  . Food insecurity - worry: Not on file  . Food insecurity - inability: Not on file  . Transportation needs - medical: Not on file  . Transportation needs - non-medical: Not on file  Occupational History  . Not on file  Tobacco Use  . Smoking status: Former Smoker    Last attempt to quit: 2012    Years since quitting: 6.9  . Smokeless tobacco: Never Used  Substance and Sexual Activity  . Alcohol use: Yes    Comment: rare  . Drug use: No  . Sexual activity: Not  on file  Other Topics Concern  . Not on file  Social History Narrative   Lives at home with wife   left handed   20 oz of caffeine daily    Family History  Problem Relation Age of Onset  . Skin cancer Unknown   . Heart disease Other   . Stroke Other   . Hypertension Mother   . Dementia Father   . Heart attack Father   . Stroke Father   . Hypertension Father   . Cancer - Other Paternal Grandfather     . Skin cancer Paternal Grandfather     Past Medical History:  Diagnosis Date  . Anxiety   . Asthma   . Chronic cholecystitis with calculus 04/25/2012  . Edema   . Low back pain   . Migraine     Past Surgical History:  Procedure Laterality Date  . APPENDECTOMY  2010  . CHOLECYSTECTOMY  06/18/12  . TONSILLECTOMY      Current Outpatient Medications  Medication Sig Dispense Refill  . albuterol (PROVENTIL) (2.5 MG/3ML) 0.083% nebulizer solution Take 2.5 mg by nebulization every 6 (six) hours as needed.    Marland Kitchen buPROPion (WELLBUTRIN XL) 300 MG 24 hr tablet Take 300 mg by mouth daily.  1  . cetirizine (ZYRTEC ALLERGY) 10 MG tablet Take 10 mg by mouth daily.    Marland Kitchen FLUoxetine (PROZAC) 20 MG tablet Take 20 mg by mouth daily.  0  . meclizine (ANTIVERT) 25 MG tablet Take 1 tablet by mouth at bedtime as needed.  1  . ondansetron (ZOFRAN) 4 MG tablet Take 4 mg by mouth every 6 (six) hours as needed. for nausea  0  . promethazine (PHENERGAN) 12.5 MG tablet Take 12.5 mg by mouth every 6 (six) hours as needed.    . pseudoephedrine (SUDAFED) 120 MG 12 hr tablet Take 120 mg by mouth every 12 (twelve) hours.     No current facility-administered medications for this visit.     Allergies as of 11/05/2017 - Review Complete 11/05/2017  Allergen Reaction Noted  . Latex  04/25/2012  . Chantix [varenicline]  11/05/2017  . Fish allergy  11/17/2014  . Imitrex [sumatriptan]  11/05/2017  . Zithromax [azithromycin]  04/25/2012    Vitals: Ht 5\' 8"  (1.727 m)   Wt 273 lb (123.8 kg)   BMI 41.51 kg/m  Last Weight:  Wt Readings from Last 1 Encounters:  11/05/17 273 lb (123.8 kg)   Last Height:   Ht Readings from Last 1 Encounters:  11/05/17 5\' 8"  (1.727 m)    Physical exam: Exam: Gen: NAD, conversant, well nourised, obese, well groomed                     CV: RRR, no MRG. No Carotid Bruits. No peripheral edema, warm, nontender Eyes: Conjunctivae clear without exudates or  hemorrhage  Neuro: Detailed Neurologic Exam  Speech:    Speech is normal; fluent and spontaneous with normal comprehension.  Cognition:    The patient is oriented to person, place, and time;     recent and remote memory intact;     language fluent;     normal attention, concentration,     fund of knowledge Cranial Nerves:    The pupils are equal, round, and reactive to light. The fundi are normal and spontaneous venous pulsations are present. Visual fields are full to finger confrontation. Extraocular movements are intact. Trigeminal sensation is intact and the muscles of mastication are normal.  The face is symmetric. The palate elevates in the midline. Hearing intact. Voice is normal. Shoulder shrug is normal. The tongue has normal motion without fasciculations.   Coordination:    Normal finger to nose and heel to shin. Normal rapid alternating movements.   Gait:    Heel-toe and tandem gait are normal.   Motor Observation:    No asymmetry, no atrophy, and no involuntary movements noted. Tone:    Normal muscle tone.    Posture:    Posture is normal. normal erect    Strength:    Strength is V/V in the upper and lower limbs.      Sensation: intact to LT     Reflex Exam:  DTR's:    Deep tendon reflexes in the upper and lower extremities are normal bilaterally.   Toes:    The toes are downgoing bilaterally.   Clonus:    Clonus is absent.      Assessment/Plan:  36 year old with chronic intractable migraines, daily positional headaches, vertigo.   ENT for evaluation of other etiologies of vertigo Discuss with PT vestibular rehab (sent email) MRI brain w/wo contrast due to positional quality and vision changes, intractable to eval for space-occupying masses, tumors or other lesions and II[facility] Sleep team for eval of OSA: morning headache, partner says she snores heavily and wakes her up despite ear plugs, also lots of movements (PLMS?) [facility] for  obesity - provided information for her to call  Orders Placed This Encounter  Procedures  . MR BRAIN W WO CONTRAST  . Ambulatory referral to ENT  . Ambulatory referral to Sleep Studies   F/u 3 months  Discussed: To prevent or relieve headaches, try the following: Cool Compress. Lie down and place a cool compress on your head.  Avoid headache triggers. If certain foods or odors seem to have triggered your migraines in the past, avoid them. A headache diary might help you identify triggers.  Include physical activity in your daily routine. Try a daily walk or other moderate aerobic exercise.  Manage stress. Find healthy ways to cope with the stressors, such as delegating tasks on your to-do list.  Practice relaxation techniques. Try deep breathing, yoga, massage and visualization.  Eat regularly. Eating regularly scheduled meals and maintaining a healthy diet might help prevent headaches. Also, drink plenty of fluids.  Follow a regular sleep schedule. Sleep deprivation might contribute to headaches Consider biofeedback. With this mind-body technique, you learn to control certain bodily functions - such as muscle tension, heart rate and blood pressure - to prevent headaches or reduce headache pain.    Proceed to emergency room if you experience new or worsening symptoms or symptoms do not resolve, if you have new neurologic symptoms or if headache is severe, or for any concerning symptom.   Provided education and documentation from American headache Society toolbox including articles on: chronic migraine medication overuse headache, chronic migraines, prevention of migraines, behavioral and other nonpharmacologic treatments for headache.  Cc: Damaris Hippo, MD   Sarina Ill, MD  [facility] [address] [place], [place] [address]  Phone [phone] Fax [phone]

## 2017-11-05 NOTE — Patient Instructions (Addendum)
ENT for evaluation of other causes of vertigo Discuss with PT vestibular rehab MRI brain w/wo contrast Sleep team for eval of OSA   Vertigo Vertigo is the feeling that you or your surroundings are moving when they are not. Vertigo can be dangerous if it occurs while you are doing something that could endanger you or others, such as driving. What are the causes? This condition is caused by a disturbance in the signals that are sent by your body's sensory systems to your brain. Different causes of a disturbance can lead to vertigo, including:  Infections, especially in the inner ear.  A bad reaction to a drug, or misuse of alcohol and medicines.  Withdrawal from drugs or alcohol.  Quickly changing positions, as when lying down or rolling over in bed.  Migraine headaches.  Decreased blood flow to the brain.  Decreased blood pressure.  Increased pressure in the brain from a head or neck injury, stroke, infection, tumor, or bleeding.  Central nervous system disorders.  What are the signs or symptoms? Symptoms of this condition usually occur when you move your head or your eyes in different directions. Symptoms may start suddenly, and they usually last for less than a minute. Symptoms may include:  Loss of balance and falling.  Feeling like you are spinning or moving.  Feeling like your surroundings are spinning or moving.  Nausea and vomiting.  Blurred vision or double vision.  Difficulty hearing.  Slurred speech.  Dizziness.  Involuntary eye movement (nystagmus).  Symptoms can be mild and cause only slight annoyance, or they can be severe and interfere with daily life. Episodes of vertigo may return (recur) over time, and they are often triggered by certain movements. Symptoms may improve over time. How is this diagnosed? This condition may be diagnosed based on medical history and the quality of your nystagmus. Your health care provider may test your eye movements by  asking you to quickly change positions to trigger the nystagmus. This may be called the Dix-Hallpike test, head thrust test, or roll test. You may be referred to a health care provider who specializes in ear, nose, and throat (ENT) problems (otolaryngologist) or a provider who specializes in disorders of the central nervous system (neurologist). You may have additional testing, including:  A physical exam.  Blood tests.  MRI.  A CT scan.  An electrocardiogram (ECG). This records electrical activity in your heart.  An electroencephalogram (EEG). This records electrical activity in your brain.  Hearing tests.  How is this treated? Treatment for this condition depends on the cause and the severity of the symptoms. Treatment options include:  Medicines to treat nausea or vertigo. These are usually used for severe cases. Some medicines that are used to treat other conditions may also reduce or eliminate vertigo symptoms. These include: ? Medicines that control allergies (antihistamines). ? Medicines that control seizures (anticonvulsants). ? Medicines that relieve depression (antidepressants). ? Medicines that relieve anxiety (sedatives).  Head movements to adjust your inner ear back to normal. If your vertigo is caused by an ear problem, your health care provider may recommend certain movements to correct the problem.  Surgery. This is rare.  Follow these instructions at home: Safety  Move slowly.Avoid sudden body or head movements.  Avoid driving.  Avoid operating heavy machinery.  Avoid doing any tasks that would cause danger to you or others if you would have a vertigo episode during the task.  If you have trouble walking or keeping your balance, try  using a cane for stability. If you feel dizzy or unstable, sit down right away.  Return to your normal activities as told by your health care provider. Ask your health care provider what activities are safe for you. General  instructions  Take over-the-counter and prescription medicines only as told by your health care provider.  Avoid certain positions or movements as told by your health care provider.  Drink enough fluid to keep your urine clear or pale yellow.  Keep all follow-up visits as told by your health care provider. This is important. Contact a health care provider if:  Your medicines do not relieve your vertigo or they make it worse.  You have a fever.  Your condition gets worse or you develop new symptoms.  Your family or friends notice any behavioral changes.  Your nausea or vomiting gets worse.  You have numbness or a "pins and needles" sensation in part of your body. Get help right away if:  You have difficulty moving or speaking.  You are always dizzy.  You faint.  You develop severe headaches.  You have weakness in your hands, arms, or legs.  You have changes in your hearing or vision.  You develop a stiff neck.  You develop sensitivity to light. This information is not intended to replace advice given to you by your health care provider. Make sure you discuss any questions you have with your health care provider. Document Released: 08/16/2005 Document Revised: 04/19/2016 Document Reviewed: 03/01/2015 Elsevier Interactive Patient Education  2017 [employer].

## 2017-11-07 ENCOUNTER — Ambulatory Visit: Payer: BC Managed Care – PPO | Admitting: Neurology

## 2017-11-07 ENCOUNTER — Encounter: Payer: Self-pay | Admitting: Neurology

## 2017-11-07 VITALS — BP 137/90 | HR 70 | Ht 68.0 in | Wt 272.0 lb

## 2017-11-07 DIAGNOSIS — G478 Other sleep disorders: Secondary | ICD-10-CM

## 2017-11-07 DIAGNOSIS — R0683 Snoring: Secondary | ICD-10-CM | POA: Diagnosis not present

## 2017-11-07 DIAGNOSIS — G2581 Restless legs syndrome: Secondary | ICD-10-CM

## 2017-11-07 DIAGNOSIS — R42 Dizziness and giddiness: Secondary | ICD-10-CM

## 2017-11-07 DIAGNOSIS — R519 Headache, unspecified: Secondary | ICD-10-CM

## 2017-11-07 DIAGNOSIS — Z6841 Body Mass Index (BMI) 40.0 and over, adult: Secondary | ICD-10-CM

## 2017-11-07 DIAGNOSIS — R51 Headache: Secondary | ICD-10-CM | POA: Diagnosis not present

## 2017-11-07 DIAGNOSIS — G4761 Periodic limb movement disorder: Secondary | ICD-10-CM

## 2017-11-07 NOTE — Progress Notes (Signed)
Subjective:    Patient ID: [name] is a 36 y.o. male.  HPI     [name], MD, [name] [facility] [address] [place],  [address]  Dear [name],   I saw your patient, [name], upon your kind request in my clinic today for initial consultation of her sleep disorder, in particular, concern for underlying obstructive sleep apnea. The patient is unaccompanied today. As you know, [name] is a 36 year old right-handed woman with an underlying medical history of migraine headaches, anxiety, asthma, low back pain, lower extremity edema allergies, vertigo and morbid obesity with a BMI of over 40, who reports snoring and recurrent vertigo and recurrent headaches. I reviewed your office note from 11/05/2017. Her Epworth sleepiness score is 2 out of 24, fatigue score is 36 out of 63. She lives with her wife. She works for [employer], as a Research officer, trade union. She quit smoking in 2012. She drinks alcohol rarely, caffeine in the form of soda about 20+ ounces daily. They do not have any children. Bedtime is around 11 PM and wakeup time around 6. She does have middle of the night awakenings sometimes. She has frequent morning headaches. She denies night to night nocturia. She has occasional symptoms of restless legs and does have a tendency to move her legs in her sleep, she reports that she looks like she is running in his sleep. They have 4 dogs in the household, one dog in the bedroom, not on their bed. They have 1 cat. She is trying to reduce her caffeine intake. She is scheduled to see ENT today. She has also been referred to weight management.  Her Past Medical History Is Significant For: Past Medical History:  Diagnosis Date  . Anxiety   . Asthma   . Chronic cholecystitis with calculus 04/25/2012  . Edema   . Low back pain   . Migraine     Her Past Surgical History Is Significant For: Past Surgical History:  Procedure Laterality  Date  . APPENDECTOMY  2010  . CHOLECYSTECTOMY  06/18/12  . TONSILLECTOMY      Her Family History Is Significant For: Family History  Problem Relation Age of Onset  . Skin cancer Unknown   . Heart disease Other   . Stroke Other   . Hypertension Mother   . Dementia Father   . Heart attack Father   . Stroke Father   . Hypertension Father   . Cancer - Other Paternal Grandfather   . Skin cancer Paternal Grandfather     Her Social History Is Significant For: Social History   Socioeconomic History  . Marital status: Married    Spouse name: None  . Number of children: 0  . Years of education: None  . Highest education level: Master's degree (e.g., MA, MS, MEng, MEd, MSW, MBA)  Social Needs  . Financial resource strain: None  . Food insecurity - worry: None  . Food insecurity - inability: None  . Transportation needs - medical: None  . Transportation needs - non-medical: None  Occupational History  . None  Tobacco Use  . Smoking status: Former Smoker    Last attempt to quit: 2012    Years since quitting: 6.9  . Smokeless tobacco: Never Used  Substance and Sexual Activity  . Alcohol use: Yes    Comment: rare  . Drug use: No  . Sexual activity: None  Other Topics Concern  . None  Social History Narrative  Lives at home with wife   left handed   20 oz of caffeine daily    Her Allergies Are:  Allergies  Allergen Reactions  . Latex   . Chantix [Varenicline]   . Fish Allergy     Shell Fish  . Imitrex [Sumatriptan]     Heart palpitations  . Zithromax [Azithromycin]     hypotension  :   Her Current Medications Are:  Outpatient Encounter Medications as of 11/07/2017  Medication Sig  . albuterol (PROVENTIL) (2.5 MG/3ML) 0.083% nebulizer solution Take 2.5 mg by nebulization every 6 (six) hours as needed.  Marland Kitchen buPROPion (WELLBUTRIN XL) 300 MG 24 hr tablet Take 300 mg by mouth daily.  . cetirizine (ZYRTEC ALLERGY) 10 MG tablet Take 10 mg by mouth daily.  Marland Kitchen  FLUoxetine (PROZAC) 20 MG tablet Take 20 mg by mouth daily.  . meclizine (ANTIVERT) 25 MG tablet Take 1 tablet by mouth at bedtime as needed.  . ondansetron (ZOFRAN) 4 MG tablet Take 4 mg by mouth every 6 (six) hours as needed. for nausea  . promethazine (PHENERGAN) 12.5 MG tablet Take 12.5 mg by mouth every 6 (six) hours as needed.  . pseudoephedrine (SUDAFED) 120 MG 12 hr tablet Take 120 mg by mouth every 12 (twelve) hours.   No facility-administered encounter medications on file as of 11/07/2017.   :  Review of Systems:  Out of a complete 14 point review of systems, all are reviewed and negative with the exception of these symptoms as listed below: Review of Systems  Neurological:       Pt presents today to discuss her sleep. Pt has never had a sleep study but does endorse snoring.''Epworth Sleepiness Scale 0= would never doze 1= slight chance of dozing 2= moderate chance of dozing 3= high chance of dozing  Sitting and reading: 0 Watching TV: 1 Sitting inactive in a public place (ex. Theater or meeting): 0 As a passenger in a car for an hour without a break: 0 Lying down to rest in the afternoon: 1 Sitting and talking to someone: 0 Sitting quietly after lunch (no alcohol): 0 In a car, while stopped in traffic: 0 Total: 2     Objective:  Neurological Exam  Physical Exam Physical Examination:   Vitals:   11/07/17 1007  BP: 137/90  Pulse: 70   General Examination: The patient is a very pleasant 36 y.o. male in no acute distress. She appears well-developed and well-nourished and well groomed. No vertiginous Sx currently.  HEENT: Normocephalic, atraumatic, pupils are equal, round and reactive to light and accommodation. Extraocular tracking is good without limitation to gaze excursion or nystagmus noted. Normal smooth pursuit is noted. Hearing is grossly intact. Face is symmetric with normal facial animation and normal facial sensation. Speech is clear with no dysarthria  noted. There is no hypophonia. There is no lip, neck/head, jaw or voice tremor. Neck is supple with full range of passive and active motion. There are no carotid bruits on auscultation. Oropharynx exam reveals: mild mouth dryness, adequate dental hygiene and mild airway crowding, due to smaller airway entry and redundant soft palate, uvula is small and tonsils are absent. Mallampati is class I. Tongue protrudes centrally and palate elevates symmetrically. Neck size is 17.25 inches. She has an absent overbite.   Chest: Clear to auscultation without wheezing, rhonchi or crackles noted.  Heart: S1+S2+0, regular and normal without murmurs, rubs or gallops noted.   Abdomen: Soft, non-tender and non-distended with normal bowel sounds appreciated  on auscultation.  Extremities: There is no pitting edema in the distal lower extremities bilaterally. Pedal pulses are intact.  Skin: Warm and dry without trophic changes noted.  Musculoskeletal: exam reveals no obvious joint deformities, tenderness or joint swelling or erythema.   Neurologically:  Mental status: The patient is awake, alert and oriented in all 4 spheres. Her immediate and remote memory, attention, language skills and fund of knowledge are appropriate. There is no evidence of aphasia, agnosia, apraxia or anomia. Speech is clear with normal prosody and enunciation. Thought process is linear. Mood is normal and affect is normal.  Cranial nerves II - XII are as described above under HEENT exam. In addition: shoulder shrug is normal with equal shoulder height noted. Motor exam: Normal bulk, strength and tone is noted. There is no drift, tremor or rebound. Romberg is negative. Reflexes are 1-2+ throughout.  Fine motor skills and coordination: intact with normal finger taps, normal hand movements, normal rapid alternating patting, normal foot taps and normal foot agility.  Cerebellar testing: No dysmetria or intention tremor on finger to nose testing.  Heel to shin is unremarkable bilaterally. There is no truncal or gait ataxia.  Sensory exam: intact to light touch in the upper and lower extremities.  Gait, station and balance: She stands easily. No veering to one side is noted. No leaning to one side is noted. Posture is age-appropriate and stance is narrow based. Gait shows normal stride length and normal pace. No problems turning are noted. Tandem walk is unremarkable.  Assessment and Plan:  In summary, RICHIE BONANNO is a very pleasant 36 y.o.-year old male with an underlying medical history of migraine headaches, anxiety, asthma, low back pain, lower extremity edema allergies, vertigo and morbid obesity with a BMI of over 40, whose history and physical exam are concerning for obstructive sleep apnea (OSA). I had a long chat with the patient about my findings and the diagnosis of OSA, its prognosis and treatment options. We talked about medical treatments, surgical interventions and non-pharmacological approaches. I explained in particular the risks and ramifications of untreated moderate to severe OSA, especially with respect to developing cardiovascular disease down the Road, including congestive heart failure, difficult to treat hypertension, cardiac arrhythmias, or stroke. Even type 2 diabetes has, in part, been linked to untreated OSA. Symptoms of untreated OSA include daytime sleepiness, memory problems, mood irritability and mood disorder such as depression and anxiety, lack of energy, as well as recurrent headaches, especially morning headaches. We talked about trying to maintain a healthy lifestyle in general, as well as the importance of weight control. I encouraged the patient to eat healthy, exercise daily and keep well hydrated, to keep a scheduled bedtime and wake time routine, to not skip any meals and eat healthy snacks in between meals. I advised the patient not to drive when feeling sleepy. I recommended the following at this  time: sleep study with potential positive airway pressure titration. (We will score hypopneas at 3%).   I explained the sleep test procedure to the patient and also outlined possible surgical and non-surgical treatment options of OSA, including the use of a custom-made dental device (which would require a referral to a specialist dentist or oral surgeon), upper airway surgical options, such as pillar implants, radiofrequency surgery, tongue base surgery, and UPPP (which would involve a referral to an ENT surgeon). Rarely, jaw surgery such as mandibular advancement may be considered.  I also explained the CPAP treatment option to the patient, who  indicated that she would be willing to try CPAP if the need arises. I explained the importance of being compliant with PAP treatment, not only for insurance purposes but primarily to improve Her symptoms, and for the patient's long term health benefit, including to reduce Her cardiovascular risks. I answered all her questions today and the patient was in agreement. I will likely see her back after the sleep study is completed and encouraged her to call with any interim questions, concerns, problems or updates.   Thank you very much for allowing me to participate in the care of this nice patient. If I can be of any further assistance to you please do not hesitate to talk to me.  Sincerely,   [name], MD, [name]

## 2017-11-07 NOTE — Patient Instructions (Signed)

## 2017-11-21 ENCOUNTER — Ambulatory Visit (INDEPENDENT_AMBULATORY_CARE_PROVIDER_SITE_OTHER): Payer: BC Managed Care – PPO | Admitting: Neurology

## 2017-11-21 DIAGNOSIS — G4761 Periodic limb movement disorder: Secondary | ICD-10-CM | POA: Diagnosis not present

## 2017-11-21 DIAGNOSIS — G478 Other sleep disorders: Secondary | ICD-10-CM

## 2017-11-21 DIAGNOSIS — G4720 Circadian rhythm sleep disorder, unspecified type: Secondary | ICD-10-CM

## 2017-11-21 DIAGNOSIS — R42 Dizziness and giddiness: Secondary | ICD-10-CM

## 2017-11-21 DIAGNOSIS — R519 Headache, unspecified: Secondary | ICD-10-CM

## 2017-11-21 DIAGNOSIS — Z6841 Body Mass Index (BMI) 40.0 and over, adult: Secondary | ICD-10-CM

## 2017-11-21 DIAGNOSIS — G2581 Restless legs syndrome: Secondary | ICD-10-CM

## 2017-11-21 DIAGNOSIS — R51 Headache: Secondary | ICD-10-CM

## 2017-11-21 DIAGNOSIS — R0683 Snoring: Secondary | ICD-10-CM

## 2017-11-28 ENCOUNTER — Ambulatory Visit: Payer: BC Managed Care – PPO

## 2017-11-28 DIAGNOSIS — R51 Headache with orthostatic component, not elsewhere classified: Secondary | ICD-10-CM

## 2017-11-28 DIAGNOSIS — H539 Unspecified visual disturbance: Secondary | ICD-10-CM | POA: Diagnosis not present

## 2017-11-28 DIAGNOSIS — R42 Dizziness and giddiness: Secondary | ICD-10-CM | POA: Diagnosis not present

## 2017-11-28 DIAGNOSIS — R519 Headache, unspecified: Secondary | ICD-10-CM

## 2017-11-28 MED ORDER — GADOPENTETATE DIMEGLUMINE 469.01 MG/ML IV SOLN: 20.0000 mL | Freq: Once | INTRAVENOUS | Status: DC | PRN

## 2017-11-29 NOTE — Procedures (Signed)
PATIENT'S NAME:  [name], Shehadeh DOB:      02/14/1981      MR#:    [id number]     DATE OF RECORDING: 11/21/2017 REFERRING M.D.: Sarina Ill, MD,       PCP: Damaris Hippo, MD Study Performed:   Baseline Polysomnogram HISTORY: 37 year old woman with a history of migraine headaches, anxiety, asthma, low back pain, lower extremity edema allergies, vertigo and morbid obesity, who reports snoring and recurrent vertigo and recurrent headaches. The patient endorsed the Epworth Sleepiness Scale at 2 points. The patient's weight 272 pounds with a height of 68 (inches), resulting in a BMI of 41.1 kg/m2. The patient's neck circumference measured 17.2 inches.  CURRENT MEDICATIONS: Proventil, Wellbutrin, Zyrtec, Prozac, Antivert, Zofran, Phenergan, Sudafed   PROCEDURE:  This is a multichannel digital polysomnogram utilizing the Somnostar 11.2 system.  Electrodes and sensors were applied and monitored per AASM Specifications.   EEG, EOG, Chin and Limb EMG, were sampled at 200 Hz.  ECG, Snore and Nasal Pressure, Thermal Airflow, Respiratory Effort, CPAP Flow and Pressure, Oximetry was sampled at 50 Hz. Digital video and audio were recorded.      BASELINE STUDY  Lights Out was at 22:17 and Lights On at 05:06.  Total recording time (TRT) was 409.5 minutes, with a total sleep time (TST) of  331 minutes.   The patient's sleep latency was 54 minutes, which is delayed. REM latency was 162.5 minutes, which is delayed. The sleep efficiency was 80.8 %.     SLEEP ARCHITECTURE: WASO (Wake after sleep onset) was 48.5 minutes with mild sleep fragmentation noted. There were 13 minutes in Stage N1, 230 minutes Stage N2, 51.5 minutes Stage N3 and 36.5 minutes in Stage REM.  The percentage of Stage N1 was 3.9%, which is normal, Stage N2 was 69.5%, which is mildly increased, Stage N3 was 15.6%, which is normal and Stage R (REM sleep) was 11%, which is reduced. The arousals were noted as: 53 were spontaneous, 3 were associated with  PLMs, 5 were associated with respiratory events.  Audio and video analysis did not show any abnormal or unusual movements, behaviors, phonations or vocalizations. The patient took 1 bathroom break. Mild to, at times, moderate snoring was noted. The EKG was in keeping with normal sinus rhythm (NSR).  RESPIRATORY ANALYSIS:  There were a total of 13 respiratory events:  0 obstructive apneas, 0 central apneas and 0 mixed apneas with a total of 0 apneas and an apnea index (AI) of 0 /hour. There were 13 hypopneas with a hypopnea index of 2.4 /hour. The patient also had 0 respiratory event related arousals (RERAs).      The total APNEA/HYPOPNEA INDEX (AHI) was 2.4/hour and the total RESPIRATORY DISTURBANCE INDEX was 2.4 /hour.  5 events occurred in REM sleep and 16 events in NREM. The REM AHI was 8.2 /hour, versus a non-REM AHI of 1.6. The patient spent 202 minutes of total sleep time in the supine position and 129 minutes in non-supine.. The supine AHI was 2.4 versus a non-supine AHI of 2.3.  OXYGEN SATURATION & C02:  The Wake baseline 02 saturation was 98%, with the lowest being 87%. Time spent below 89% saturation equaled 2 minutes.  PERIODIC LIMB MOVEMENTS: The patient had a total of 5 Periodic Limb Movements.  The Periodic Limb Movement (PLM) index was .9 and the PLM Arousal index was .5/hour.  Post-study, the patient indicated that sleep was better than usual.   IMPRESSION:  1. Primary Snoring 2. Dysfunctions  associated with sleep stages or arousal from sleep  RECOMMENDATIONS:  1. This study does not demonstrate any significant obstructive or central sleep disordered breathing with the exception of mild to moderate snoring and mild, REM related OSA; for this CPAP therapy is not warranted. Weight loss and avoiding the supine sleep position with likely improve these issues; for disturbing snoring, an oral appliance (through a qualified dentist) can be considered.  2. This study does not support an  intrinsic sleep disorder as a cause of the patient's symptoms. Other causes, including circadian rhythm disturbances, an underlying mood disorder, medication effect and/or an underlying medical problem cannot be ruled out. 3. This study mild shows sleep fragmentation and mildly abnormal sleep stage percentages; these are nonspecific findings and per se do not signify an intrinsic sleep disorder or a cause for the patient's sleep-related symptoms. Causes include (but are not limited to) the first night effect of the sleep study, circadian rhythm disturbances, medication effect or an underlying mood disorder or medical problem.  4. The patient should be cautioned not to drive, work at heights, or operate dangerous or heavy equipment when tired or sleepy. Review and reiteration of good sleep hygiene measures should be pursued with any patient. 5. The patient will can follow-up with her referring provider, who will be notified of the test results.  I certify that I have reviewed the entire raw data recording prior to the issuance of this report in accordance with the Standards of Accreditation of the American Academy of Sleep Medicine (AASM)   [name], MD, [name] Diplomat, American Board of Psychiatry and Neurology (Neurology and Sleep Medicine)

## 2017-11-29 NOTE — Progress Notes (Signed)
Patient referred by Dr. Jaynee Eagles, seen by me on 11/07/17, diagnostic PSG on 11/21/17.   Please call and notify the patient that the recent sleep study did not show any significant obstructive sleep apnea with the exception of mild to moderate snoring and mild, REM related OSA; for this CPAP therapy is not warranted. Weight loss and avoiding the supine sleep position with likely improve these issues; for disturbing snoring, an oral appliance (through a qualified dentist) can be considered. Please inform patient that she can FU with Dr. Jaynee Eagles as planned.  Please remind patient to try to maintain good sleep hygiene, which means: Keep a regular sleep and wake schedule and make enough time for sleep (7 1/2 to 8 1/2 hours for the average adult), try not to exercise or have a meal within 2 hours of your bedtime, try to keep your bedroom conducive for sleep, that is, cool and dark, without light distractors such as an illuminated alarm clock, and refrain from watching TV right before sleep or in the middle of the night and do not keep the TV or radio on during the night. If a nightlight is used, have it away from the visual field. Also, try not to use or play on electronic devices at bedtime, such as your cell phone, tablet PC or laptop. If you like to read at bedtime on an electronic device, try to dim the background light as much as possible. Do not eat in the middle of the night. Keep pets away from the bedroom environment. For stress relief, try meditation, deep breathing exercises (there are many books and CDs available), a white noise machine or fan can help to diffuse other noise distractors, such as traffic noise. Do not drink alcohol before bedtime, as it can disturb sleep and cause middle of the night awakenings. Never mix alcohol and sedating medications! Avoid narcotic pain medication close to bedtime, as opioids/narcotics can suppress breathing drive and breathing effort.    Thanks,  [name], MD,  [name] [facility] [place])

## 2017-11-30 ENCOUNTER — Telehealth: Payer: Self-pay | Admitting: *Deleted

## 2017-11-30 NOTE — Telephone Encounter (Signed)
-----   Message from Melvenia Beam, MD sent at 11/29/2017  5:22 PM EST ----- Mri brain normal

## 2017-11-30 NOTE — Telephone Encounter (Signed)
Called patient and discussed normal MRI results. Questions answered. She verbalized understanding and appreciation.

## 2017-12-03 ENCOUNTER — Telehealth: Payer: Self-pay

## 2017-12-03 NOTE — Telephone Encounter (Signed)
I called pt. I advised pt that Dr. Rexene Alberts reviewed pt's sleep study and found that pt does not have any significant osa, just mild to moderate snoring, and mild REM related osa. Dr. Rexene Alberts recommends that pt avoid supine sleep and pursue weight loss. I advised pt that if her snoring is disturbing, she can speak with a qualified dentist to discuss an oral appliance to treat the snoring. I reviewed sleep hygiene recommendations with the pt, including trying to keep a regular sleep wake schedule, avoiding electronics in the bedroom, keeping the bedroom cool, dark, and quiet, and avoiding eating or exercising within 2 hours of bedtime as well as eating in the middle of the night. I advised pt to keep pets out of the bedroom. I discussed with pt the importance of stress relief and to try meditation, deep breathing exercises, and/or a white noise machine or fan to diffuse other noise distracters. I advised pt to not drink alcohol before bedtime and to never mix alcohol and sedating medications. Pt was advised to avoid narcotic pain medication close to bedtime. I advised pt that a copy of these sleep study results will be sent to Dr. Jaynee Eagles. Pt verbalized understanding of results. Pt had no questions at this time but was encouraged to call back if questions arise.

## 2017-12-03 NOTE — Telephone Encounter (Signed)
-----   Message from [name], MD sent at 11/29/2017  8:35 AM EST ----- Patient referred by Dr. Jaynee Eagles, seen by me on 11/07/17, diagnostic PSG on 11/21/17.   Please call and notify the patient that the recent sleep study did not show any significant obstructive sleep apnea with the exception of mild to moderate snoring and mild, REM related OSA; for this CPAP therapy is not warranted. Weight loss and avoiding the supine sleep position with likely improve these issues; for disturbing snoring, an oral appliance (through a qualified dentist) can be considered. Please inform patient that she can FU with Dr. Jaynee Eagles as planned.  Please remind patient to try to maintain good sleep hygiene, which means: Keep a regular sleep and wake schedule and make enough time for sleep (7 1/2 to 8 1/2 hours for the average adult), try not to exercise or have a meal within 2 hours of your bedtime, try to keep your bedroom conducive for sleep, that is, cool and dark, without light distractors such as an illuminated alarm clock, and refrain from watching TV right before sleep or in the middle of the night and do not keep the TV or radio on during the night. If a nightlight is used, have it away from the visual field. Also, try not to use or play on electronic devices at bedtime, such as your cell phone, tablet PC or laptop. If you like to read at bedtime on an electronic device, try to dim the background light as much as possible. Do not eat in the middle of the night. Keep pets away from the bedroom environment. For stress relief, try meditation, deep breathing exercises (there are many books and CDs available), a white noise machine or fan can help to diffuse other noise distractors, such as traffic noise. Do not drink alcohol before bedtime, as it can disturb sleep and cause middle of the night awakenings. Never mix alcohol and sedating medications! Avoid narcotic pain medication close to bedtime, as opioids/narcotics can suppress  breathing drive and breathing effort.    Thanks,  [name], MD, [name] [facility])

## 2018-02-04 ENCOUNTER — Ambulatory Visit: Payer: BC Managed Care – PPO | Admitting: Neurology

## 2018-02-04 ENCOUNTER — Encounter: Payer: Self-pay | Admitting: Neurology

## 2018-02-04 VITALS — BP 115/73 | HR 67 | Ht 67.0 in | Wt 274.0 lb

## 2018-02-04 DIAGNOSIS — E669 Obesity, unspecified: Secondary | ICD-10-CM | POA: Diagnosis not present

## 2018-02-04 DIAGNOSIS — G43009 Migraine without aura, not intractable, without status migrainosus: Secondary | ICD-10-CM | POA: Diagnosis not present

## 2018-02-04 MED ORDER — ISOMETHEPTENE-DICHLORAL-APAP 65-100-325 MG PO CAPS: 1.0000 | ORAL_CAPSULE | Freq: Four times a day (QID) | ORAL | 4 refills | Status: DC | PRN

## 2018-02-04 NOTE — Progress Notes (Signed)
[facility]    Provider:  Dr Jaynee Eagles Referring Provider: Damaris Hippo, MD  Primary Care Physician:  Damaris Hippo, MD  CC:  Vertigo  Interval history 02/04/2018: She stopped her wellbutrin and prozac and the migraines are improved.  She stopped the wellbutrin the beginning of the year.  She has only had 2 migraines since last being seen. She is doing well on the Celexa.  She is eating better. She is taking a daily vitamin now. Discussed the healthy weight and wellness center for obesity and placed referral.  HPI:  [name] is a 37 y.o. male here as a referral from Dr. Inda Castle for vertigo.  Past medical history migraine, depression, anxiety.  Here with partner who provides much information. 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  Also past medical history of obesity, chronic low back pain, left foot pain, meningiomas and incidental finding on head CT in December 2017.  She is on sertraline, Wellbutrin, Zofran, promethazine and meclizine as well. She started having migraines at the age of 7. Vertigo started 2 years ago. She denies ever seeing ENT. She was driving and her eyes started "darting to the left". She is a Research officer, trade [employer]. In June of this year started becoming more common, worse with heat and exertion. Last 5-10 minutes. She feels like her eyes are darting, this is the worst feeling, always to the left, people can see it. Sometimes it is room spinning but really its the eyes darting. She saw her eye doctor. She has migraines. She has daily headaches, she has morning headaches. She snores and has morning headaches. Partner can hear her snoring through ear plugs and a fan, wake ups with headaches. Constantly rolling and kicking all night.  Migraines are behind the eyes, pulsating, throbbing, light and sound sensitivity, 2 days a month. No other focal neurologic deficits, associated symptoms, inciting events or modifiable factors.  Tried: Fioricet, zomig,  imitrex (side effects), midrin, toradol, topamax, daith piercing. Propranolol.   Reviewed notes, labs and imaging from outside physicians, which showed:  Patient was diagnosed with vestibular migraines by ear nose and throat in 2016(patient denies).  It appears she is already been to vestibular therapy, episodes are getting more intense and more often, 3 episodes in 1 day having to take Zofran and meclizine, she is gotten sick and vomited, she is on a scopolamine patch which is working, also vision changes and dizziness, the dizziness is described as spinning, worse when it is hot outside, better with laying down, can be positional however, stressors have been increasing, she also has a history of asthma.  Reviewed labs July 2018 which includes normal CBC, normal CMP with BUN 4 and creatinine 0.68, TSH was normal in 2015.  Review of Systems: Patient complains of symptoms per HPI as well as the following symptoms: Blurred vision, double vision, spinning sensation, confusion, headache, dizziness, insomnia, snoring, depression, anxiety. Pertinent negatives and positives per HPI. All others negative.   Social History   Socioeconomic History  . Marital status: Married    Spouse name: Not on file  . Number of children: 0  . Years of education: Not on file  . Highest education level: Master's degree (e.g., MA, MS, MEng, MEd, MSW, MBA)  Social Needs  . Financial resource strain: Not on file  . Food insecurity - worry: Not on file  . Food insecurity - inability: Not on file  . Transportation needs - medical: Not on file  . Transportation needs -  non-medical: Not on file  Occupational History  . Not on file  Tobacco Use  . Smoking status: Former Smoker    Last attempt to quit: 2012    Years since quitting: 7.2  . Smokeless tobacco: Never Used  Substance and Sexual Activity  . Alcohol use: Yes    Comment: rare  . Drug use: No  . Sexual activity: Not on file  Other Topics Concern  . Not on  file  Social History Narrative   Lives at home with wife   left handed   Quit caffeine December 2018    Family History  Problem Relation Age of Onset  . Skin cancer Unknown   . Heart disease Other   . Stroke Other   . Hypertension Mother   . Dementia Father   . Heart attack Father   . Stroke Father   . Hypertension Father   . Cancer - Other Paternal Grandfather   . Skin cancer Paternal Grandfather     Past Medical History:  Diagnosis Date  . Anxiety   . Asthma   . Chronic cholecystitis with calculus 04/25/2012  . Edema   . Low back pain   . Migraine     Past Surgical History:  Procedure Laterality Date  . APPENDECTOMY  2010  . CHOLECYSTECTOMY  06/18/12  . TONSILLECTOMY      Current Outpatient Medications  Medication Sig Dispense Refill  . albuterol (PROVENTIL) (2.5 MG/3ML) 0.083% nebulizer solution Take 2.5 mg by nebulization every 6 (six) hours as needed.    . cetirizine (ZYRTEC ALLERGY) 10 MG tablet Take 10 mg by mouth daily.    . citalopram (CELEXA) 10 MG tablet Take 10 mg by mouth daily.    . meclizine (ANTIVERT) 25 MG tablet Take 1 tablet by mouth at bedtime as needed.  1  . ondansetron (ZOFRAN) 4 MG tablet Take 4 mg by mouth every 6 (six) hours as needed. for nausea  0  . promethazine (PHENERGAN) 12.5 MG tablet Take 12.5 mg by mouth every 6 (six) hours as needed.    . pseudoephedrine (SUDAFED) 120 MG 12 hr tablet Take 120 mg by mouth every 12 (twelve) hours.    . terbinafine (LAMISIL) 250 MG tablet Take 250 mg by mouth daily. For 6 weeks    . isometheptene-acetaminophen-dichloralphenazone (MIDRIN) 65-100-325 MG capsule Take 1 capsule by mouth 4 (four) times daily as needed for migraine. Maximum 5 capsules in 12 hours for migraine headaches, 8 capsules in 24 hours for tension headaches. 30 capsule 4   No current facility-administered medications for this visit.    Facility-Administered Medications Ordered in Other Visits  Medication Dose Route Frequency Provider  Last Rate Last Dose  . gadopentetate dimeglumine (MAGNEVIST) injection 20 mL  20 mL Intravenous Once PRN Melvenia Beam, MD        Allergies as of 02/04/2018 - Review Complete 02/04/2018  Allergen Reaction Noted  . Latex  04/25/2012  . Chantix [varenicline]  11/05/2017  . Fish allergy  11/17/2014  . Imitrex [sumatriptan]  11/05/2017  . Zithromax [azithromycin]  04/25/2012    Vitals: BP 115/73 (BP Location: Left Arm, Patient Position: Sitting)   Pulse 67   Ht 5\' 7"  (1.702 m)   Wt 274 lb (124.3 kg)   BMI 42.91 kg/m   Last Weight:  Wt Readings from Last 1 Encounters:  02/04/18 274 lb (124.3 kg)   Last Height:   Ht Readings from Last 1 Encounters:  02/04/18 5\' 7"  (1.702 m)  Physical exam: Exam: Gen: NAD, conversant, well nourised, obese, well groomed                     CV: RRR, no MRG. No Carotid Bruits. No peripheral edema, warm, nontender Eyes: Conjunctivae clear without exudates or hemorrhage  Neuro: Detailed Neurologic Exam  Speech:    Speech is normal; fluent and spontaneous with normal comprehension.  Cognition:    The patient is oriented to person, place, and time;     recent and remote memory intact;     language fluent;     normal attention, concentration,     fund of knowledge Cranial Nerves:    The pupils are equal, round, and reactive to light. The fundi are normal and spontaneous venous pulsations are present. Visual fields are full to finger confrontation. Extraocular movements are intact. Trigeminal sensation is intact and the muscles of mastication are normal. The face is symmetric. The palate elevates in the midline. Hearing intact. Voice is normal. Shoulder shrug is normal. The tongue has normal motion without fasciculations.   Coordination:    Normal finger to nose and heel to shin. Normal rapid alternating movements.   Gait:    Heel-toe and tandem gait are normal.   Motor Observation:    No asymmetry, no atrophy, and no involuntary  movements noted. Tone:    Normal muscle tone.    Posture:    Posture is normal. normal erect    Strength:    Strength is V/V in the upper and lower limbs.      Sensation: intact to LT     Reflex Exam:  DTR's:    Deep tendon reflexes in the upper and lower extremities are normal bilaterally.   Toes:    The toes are downgoing bilaterally.   Clonus:    Clonus is absent.      Assessment/Plan:  37 year old with chronic intractable migraines, daily positional headaches, vertigo.    MRI brain, ENT evals, PT vestibular therapy, sleep apnea all negatibe workups She stopped wellbutrin and the daily headaches improved. F/u as needed Discussed obesity and [facility], placed referral For migraines: episodic, use OTC medications as needed   Cc: Damaris Hippo, MD  A total of 15vminutes was spent in with this patient face-to-face. Over half this time was spent on counseling patient on the migraine, obesity, medicatoon induced headache diagnosis and different therapeutic options available.    Sarina Ill, MD  [facility] [address] [place], [place] [address]  Phone [phone] Fax [phone]

## 2018-02-04 NOTE — Patient Instructions (Signed)
Ketorolac injection What is this medicine? KETOROLAC (kee toe ROLE ak) is a non-steroidal anti-inflammatory drug (NSAID). It is used to treat moderate to severe pain for up to 5 days. It is commonly used after surgery. This medicine should not be used for more than 5 days. This medicine may be used for other purposes; ask your health care provider or pharmacist if you have questions. COMMON BRAND NAME(S): Toradol What should I tell my health care provider before I take this medicine? They need to know if you have any of these conditions: -asthma, especially aspirin-sensitive asthma -bleeding problems -kidney disease -stomach bleed, ulcer, or other problem -taking aspirin, other NSAID, or probenecid -an unusual or allergic reaction to ketorolac, tromethamine, aspirin, other NSAIDs, other medicines, foods, dyes or preservatives -pregnant or trying to get pregnant -breast-feeding How should I use this medicine? This medicine is for injection into a muscle or into a vein. It is given by a health care professional in a hospital or clinic setting. Talk to your pediatrician regarding the use of this medicine in children. Special care may be needed. Patients over 49 years old may have a stronger reaction and need a smaller dose. Overdosage: If you think you have taken too much of this medicine contact a poison control center or emergency room at once. NOTE: This medicine is only for you. Do not share this medicine with others. What if I miss a dose? This does not apply. What may interact with this medicine? Do not take this medicine with any of the following medications: -aspirin and aspirin-like medicines -cidofovir -methotrexate -NSAIDs, medicines for pain and inflammation, like ibuprofen or naproxen -pentoxifylline -probenecid This medicine may also interact with the following  medications: -alcohol -alendronate -alprazolam -carbamazepine -diuretics -flavocoxid -fluoxetine -ginkgo -lithium -medicines for blood pressure like enalapril -medicines that affect platelets like pentoxifylline -medicines that treat or prevent blood clots like heparin, warfarin -muscle relaxants -pemetrexed -phenytoin -thiothixene This list may not describe all possible interactions. Give your health care provider a list of all the medicines, herbs, non-prescription drugs, or dietary supplements you use. Also tell them if you smoke, drink alcohol, or use illegal drugs. Some items may interact with your medicine. What should I watch for while using this medicine? Tell your doctor or healthcare professional if your symptoms do not start to get better or if they get worse. This medicine does not prevent heart attack or stroke. In fact, this medicine may increase the chance of a heart attack or stroke. The chance may increase with longer use of this medicine and in people who have heart disease. If you take aspirin to prevent heart attack or stroke, talk with your doctor or health care professional. Do not take medicines such as ibuprofen and naproxen with this medicine. Side effects such as stomach upset, nausea, or ulcers may be more likely to occur. Many medicines available without a prescription should not be taken with this medicine. This medicine can cause ulcers and bleeding in the stomach and intestines at any time during treatment. Do not smoke cigarettes or drink alcohol. These increase irritation to your stomach and can make it more susceptible to damage from this medicine. Ulcers and bleeding can happen without warning symptoms and can cause death. This medicine can cause you to bleed more easily. Try to avoid damage to your teeth and gums when you brush or floss your teeth. What side effects may I notice from receiving this medicine? Side effects that you should report to your  doctor or health care professional as soon as possible: -allergic reactions like skin rash, itching or hives, swelling of the face, lips, or tongue -breathing problems -high blood pressure -nausea, vomiting -redness, blistering, peeling or loosening of the skin, including inside the mouth -severe stomach pain -signs and symptoms of bleeding such as bloody or black, tarry stools; red or dark-brown urine; spitting up blood or brown material that looks like coffee grounds; red spots on the skin; unusual bruising or bleeding from the eye, gums, or nose -signs and symptoms of a blood clot changes in vision; chest pain; severe, sudden headache; trouble speaking; sudden numbness or weakness of the face, arm, or leg -trouble passing urine or change in the amount of urine -unexplained weight gain or swelling -unusually weak or tired -yellowing of eyes or skin Side effects that usually do not require medical attention (report to your doctor or health care professional if they continue or are bothersome): -diarrhea -dizziness -headache -heartburn This list may not describe all possible side effects. Call your doctor for medical advice about side effects. You may report side effects to FDA at 1-800-FDA-1088. Where should I keep my medicine? This drug is given in a hospital or clinic and will not be stored at home. NOTE: This sheet is a summary. It may not cover all possible information. If you have questions about this medicine, talk to your doctor, pharmacist, or health care provider.  2018 Elsevier/Gold Standard (2016-11-15 14:38:40)  

## 2018-02-12 ENCOUNTER — Encounter: Payer: Self-pay | Admitting: Neurology

## 2018-02-15 MED ORDER — KETOROLAC TROMETHAMINE 30 MG/ML IJ SOLN: 30.0000 mg | Freq: Four times a day (QID) | INTRAMUSCULAR | 5 refills | Status: DC | PRN

## 2018-02-18 ENCOUNTER — Encounter (INDEPENDENT_AMBULATORY_CARE_PROVIDER_SITE_OTHER): Payer: Self-pay | Admitting: Family Medicine

## 2018-02-19 NOTE — Telephone Encounter (Signed)
Please call to confirm

## 2018-02-21 ENCOUNTER — Encounter (INDEPENDENT_AMBULATORY_CARE_PROVIDER_SITE_OTHER): Payer: BC Managed Care – PPO

## 2018-02-28 ENCOUNTER — Ambulatory Visit (INDEPENDENT_AMBULATORY_CARE_PROVIDER_SITE_OTHER): Payer: BC Managed Care – PPO | Admitting: Family Medicine

## 2018-03-22 ENCOUNTER — Other Ambulatory Visit: Payer: Self-pay | Admitting: Obstetrics & Gynecology

## 2018-04-23 NOTE — Patient Instructions (Addendum)
Your procedure is scheduled on:  Wednesday, June 19   Enter through the [employer] [facility] at: 2:15 pm  Pick up the phone at the desk and dial [phone].  Call this number if you have problems the morning of surgery: [phone].  Remember: Do NOT eat food after midnight Tuesday  Do NOT drink clear liquids (including water) after 9:30 am day of surgery  Take these medicines the morning of surgery with a SIP OF WATER: celexa  Bring inhaler with you on day of surgery.  Do Not smoke on the day of surgery.  Stop herbal medications and supplements at this time.  Do NOT wear jewelry (body piercing), metal hair clips/bobby pins, make-up, or nail polish. Do NOT wear lotions, powders, or perfumes.  You may wear deoderant. Do NOT shave for 48 hours prior to surgery. Do NOT bring valuables to the hospital. Contacts, dentures, or bridgework may not be worn into surgery.   Have a responsible adult drive you home and stay with you for 24 hours after your procedure.

## 2018-04-26 ENCOUNTER — Encounter (HOSPITAL_COMMUNITY): Payer: Self-pay

## 2018-04-26 ENCOUNTER — Encounter (HOSPITAL_COMMUNITY)
Admission: RE | Admit: 2018-04-26 | Discharge: 2018-04-26 | Disposition: A | Discharge status: Home / Self Care | Payer: BC Managed Care – PPO | Source: Ambulatory Visit | Attending: Obstetrics & Gynecology | Admitting: Obstetrics & Gynecology

## 2018-04-26 ENCOUNTER — Other Ambulatory Visit: Payer: Self-pay

## 2018-04-26 DIAGNOSIS — Z01812 Encounter for preprocedural laboratory examination: Secondary | ICD-10-CM | POA: Diagnosis not present

## 2018-04-29 NOTE — H&P (Signed)
37yo G0 who presents for hysteroscopy, D&C, Novasure ablation due to AUB.  In review, she reports heavy and irregular bleeding. Sometimes it may be a week or 4 weeks before she has a period.  Bleeding is moderate to heavy using 6-7 super pads per day. On average the bleeding will usually last 6-9 dyas. She also notes significant dysmenorrhea. No vaginal discharge, itching or irritation. No pelvic or abdominal pain outside of her menses.  Current Medications  Taking   Promethazine HCl 25 MG Tablet 1 tablet as needed Orally every 6-8 hours prn   Ondansetron HCl 4 MG Tablet 1 tablet Orally every 6 hrs as needed for nausea   Albuterol Sulfate HFA 108 (90 Base) MCG/ACT Aerosol Solution 2 puffs as needed Inhalation every 6 hrs prn   Celexa(Citalopram Hydrobromide) 10 MG Tablet 1 tablet Orally Once a day   Ketorolac Tromethamine 30 MG/ML Solution 0.5 ml as needed Injection every 6 hrs   Not-Taking   Wellbutrin XL(buPROPion HCl ER (XL)) 300 MG Tablet Extended Release 24 Hour 1 tablet Orally Once a day   APAP-Isometheptene-Dichloral 325-65-100 MG Capsule 1 capsule Orally every 6-8 hours as needed for migraines   Terbinafine HCl 250 MG Tablet 1 tablet Orally Once a day   Medication List reviewed and reconciled with the patient    Past Medical History  Asthma.   migraine headache with diagnosis of vestibular migraines by ENT (2016). Also being followed by neurology [facility].   Obesity.   chronic low back pain- Dr Leafy Half (chiropractor).   left foot pain- [facility].   meningioma as incidental finding on head CT (12/17).   Suspected OSA. Per neurology.    Surgical History  tonsillectomy   appendectomy 04/25/09  bone spurs on both great toes- [facility] 4/17  cholecystectomy    Family History  Father: alive, high cholesterol, stroke, skin cancer  Mother: alive, high cholesterol  Paternal Grand Father: deceased, skin cancer, Non Hodkins lymphoma  Maternal Grand Father: deceased, skin  cancer  denies any GYN family cancer hx.   Social History  General:  Tobacco use  cigarettes: Former smoker Quit in year 08/09/2016 Also quit e-cigs Tobacco history last updated 03/22/2018 Alcohol: Rare, liquor.  Recreational drug use: no.  DIET: regular.  Exercise: 1-2 times per week.  Marital Status: married, lesbian partner ([name]).  Children: none.  EDUCATION: Masters 2016. Is in school with [employer].  OCCUPATION: employed, Engineer, manufacturing systems.  Religion: Methodist.  Seat belt use: yes.  Firearms at home: yes, keeps loaded guns in the house as required by her job.    Gyn History  Sexual activity currently sexually active.  Periods : irregular.  LMP 03/04/18.  Birth control Same Sex relationship.  Last pap smear date 02/18/18-neg.  STD none.    OB History  Never been pregnant per patient.    Allergies  Azithromycin  Chantix  Imitrex  Latex Exam Gloves   Hospitalization/Major Diagnostic Procedure  No Hospitalization History.   Review of Systems  CONSTITUTIONAL:  no Fatigue. no Fever. no Weight gain. no Weight loss.  HEENT:  no nasal congestion. no Visual changes.  CARDIOLOGY:  no Chest pain. no Dyspnea on exertion. no Edema. no Palpitations.  RESPIRATORY:  no Shortness of breath. no Cough. no Hemoptysis.  UROLOGY:  no Pain with urination. no Urinary urgency. no Urinary frequency. no Urinary incontinence.  GASTROENTEROLOGY:  no Change in bowel habits. no Constipation. no Diarrhea. no Incontinence of stool.  MALE REPRODUCTIVE:  no Abnormal vaginal bleeding.  no Abnormal vaginal discharge. no Breast pain. no Breast tenderness. no Dyspareunia. no Hot flashes. no Vaginal irritation. no Vaginal itching.  NEUROLOGY:  no Dizziness. no Fainting. Headache yes, h/o migraines with aura.  PSYCHOLOGY:  Anxiety yes, on celexa. no Depression.  SKIN:  no Rash. no Hives.  ENDOCRINOLOGY:  no Cold intolerance. no Excessive thirst. no Excessive urination.     A/P: 37yo G0 for  hysteroscopy, D&C, Novasure ablation due to AUB -NPO -LR @ 125cc/hr -antibiotics not indicated -SCDs to OR -Risk/benefit and alternatives reviewed with patient including but not limited to risk of bleeding, infection, uterine perforation or failure/inability to complete procedure.  Pt aware and wishes to proceed  Janyth Pupa, DO [phone] (cell) [phone] (office)

## 2018-05-07 ENCOUNTER — Encounter (HOSPITAL_COMMUNITY): Payer: Self-pay

## 2018-05-08 ENCOUNTER — Ambulatory Visit (HOSPITAL_COMMUNITY): Payer: BC Managed Care – PPO | Admitting: Anesthesiology

## 2018-05-08 ENCOUNTER — Encounter (HOSPITAL_COMMUNITY): Payer: Self-pay

## 2018-05-08 ENCOUNTER — Ambulatory Visit (HOSPITAL_COMMUNITY)
Admission: AD | Admit: 2018-05-08 | Discharge: 2018-05-08 | Disposition: A | Discharge status: Home / Self Care | Payer: BC Managed Care – PPO | Source: Ambulatory Visit | Attending: Obstetrics & Gynecology | Admitting: Obstetrics & Gynecology

## 2018-05-08 ENCOUNTER — Other Ambulatory Visit: Payer: Self-pay

## 2018-05-08 ENCOUNTER — Encounter (HOSPITAL_COMMUNITY)
Admission: AD | Disposition: A | Discharge status: Home / Self Care | Payer: Self-pay | Source: Ambulatory Visit | Attending: Obstetrics & Gynecology

## 2018-05-08 DIAGNOSIS — G43809 Other migraine, not intractable, without status migrainosus: Secondary | ICD-10-CM | POA: Diagnosis not present

## 2018-05-08 DIAGNOSIS — Z91040 Latex allergy status: Secondary | ICD-10-CM | POA: Diagnosis not present

## 2018-05-08 DIAGNOSIS — Z808 Family history of malignant neoplasm of other organs or systems: Secondary | ICD-10-CM | POA: Insufficient documentation

## 2018-05-08 DIAGNOSIS — Z888 Allergy status to other drugs, medicaments and biological substances status: Secondary | ICD-10-CM | POA: Insufficient documentation

## 2018-05-08 DIAGNOSIS — Z881 Allergy status to other antibiotic agents status: Secondary | ICD-10-CM | POA: Insufficient documentation

## 2018-05-08 DIAGNOSIS — Z8249 Family history of ischemic heart disease and other diseases of the circulatory system: Secondary | ICD-10-CM | POA: Insufficient documentation

## 2018-05-08 DIAGNOSIS — J45909 Unspecified asthma, uncomplicated: Secondary | ICD-10-CM | POA: Diagnosis not present

## 2018-05-08 DIAGNOSIS — Z87891 Personal history of nicotine dependence: Secondary | ICD-10-CM | POA: Insufficient documentation

## 2018-05-08 DIAGNOSIS — N938 Other specified abnormal uterine and vaginal bleeding: Secondary | ICD-10-CM | POA: Diagnosis present

## 2018-05-08 DIAGNOSIS — M545 Low back pain: Secondary | ICD-10-CM | POA: Diagnosis not present

## 2018-05-08 DIAGNOSIS — N946 Dysmenorrhea, unspecified: Secondary | ICD-10-CM | POA: Diagnosis not present

## 2018-05-08 DIAGNOSIS — Z823 Family history of stroke: Secondary | ICD-10-CM | POA: Insufficient documentation

## 2018-05-08 DIAGNOSIS — Z9049 Acquired absence of other specified parts of digestive tract: Secondary | ICD-10-CM | POA: Diagnosis not present

## 2018-05-08 DIAGNOSIS — M79672 Pain in left foot: Secondary | ICD-10-CM | POA: Insufficient documentation

## 2018-05-08 DIAGNOSIS — Z807 Family history of other malignant neoplasms of lymphoid, hematopoietic and related tissues: Secondary | ICD-10-CM | POA: Insufficient documentation

## 2018-05-08 DIAGNOSIS — G8929 Other chronic pain: Secondary | ICD-10-CM | POA: Insufficient documentation

## 2018-05-08 HISTORY — PX: HYSTEROSCOPY WITH D & C: SHX1775

## 2018-05-08 SURGERY — DILATATION AND CURETTAGE /HYSTEROSCOPY
Anesthesia: General | Site: Vagina

## 2018-05-08 MED ORDER — MIDAZOLAM HCL 2 MG/2ML IJ SOLN
INTRAMUSCULAR | Status: AC
  Filled 2018-05-08: qty 2

## 2018-05-08 MED ORDER — LIDOCAINE HCL (CARDIAC) PF 100 MG/5ML IV SOSY
PREFILLED_SYRINGE | INTRAVENOUS | Status: DC | PRN
  Administered 2018-05-08: 100 mg via INTRAVENOUS

## 2018-05-08 MED ORDER — HYDROMORPHONE HCL 1 MG/ML IJ SOLN
INTRAMUSCULAR | Status: AC
  Filled 2018-05-08: qty 1

## 2018-05-08 MED ORDER — CEFAZOLIN SODIUM-DEXTROSE 2-4 GM/100ML-% IV SOLN
INTRAVENOUS | Status: AC
  Filled 2018-05-08: qty 100

## 2018-05-08 MED ORDER — HYDROCODONE-ACETAMINOPHEN 7.5-325 MG PO TABS
ORAL_TABLET | ORAL | Status: AC
  Filled 2018-05-08: qty 1

## 2018-05-08 MED ORDER — MEPERIDINE HCL 25 MG/ML IJ SOLN: 6.2500 mg | INTRAMUSCULAR | Status: DC | PRN

## 2018-05-08 MED ORDER — DEXAMETHASONE SODIUM PHOSPHATE 10 MG/ML IJ SOLN
INTRAMUSCULAR | Status: DC | PRN
  Administered 2018-05-08: 4 mg via INTRAVENOUS

## 2018-05-08 MED ORDER — CEFAZOLIN SODIUM-DEXTROSE 2-4 GM/100ML-% IV SOLN
2.0000 g | Freq: Once | INTRAVENOUS | Status: AC
  Administered 2018-05-08: 2 g via INTRAVENOUS
  Filled 2018-05-08: qty 100

## 2018-05-08 MED ORDER — HYDROCODONE-ACETAMINOPHEN 7.5-325 MG PO TABS
1.0000 | ORAL_TABLET | Freq: Once | ORAL | Status: AC | PRN
  Administered 2018-05-08: 1 via ORAL

## 2018-05-08 MED ORDER — PROPOFOL 10 MG/ML IV BOLUS
INTRAVENOUS | Status: DC | PRN
  Administered 2018-05-08: 30 mg via INTRAVENOUS
  Administered 2018-05-08: 200 mg via INTRAVENOUS

## 2018-05-08 MED ORDER — METOCLOPRAMIDE HCL 5 MG/ML IJ SOLN: 10.0000 mg | Freq: Once | INTRAMUSCULAR | Status: DC | PRN

## 2018-05-08 MED ORDER — SCOPOLAMINE 1 MG/3DAYS TD PT72
1.0000 | MEDICATED_PATCH | Freq: Once | TRANSDERMAL | Status: DC
  Administered 2018-05-08: 1.5 mg via TRANSDERMAL

## 2018-05-08 MED ORDER — ONDANSETRON HCL 4 MG/2ML IJ SOLN
INTRAMUSCULAR | Status: DC | PRN
  Administered 2018-05-08: 4 mg via INTRAVENOUS

## 2018-05-08 MED ORDER — FENTANYL CITRATE (PF) 100 MCG/2ML IJ SOLN
25.0000 ug | INTRAMUSCULAR | Status: DC | PRN
  Administered 2018-05-08 (×3): 50 ug via INTRAVENOUS

## 2018-05-08 MED ORDER — SCOPOLAMINE 1 MG/3DAYS TD PT72
MEDICATED_PATCH | TRANSDERMAL | Status: AC
  Administered 2018-05-08: 1.5 mg via TRANSDERMAL
  Filled 2018-05-08: qty 1

## 2018-05-08 MED ORDER — FENTANYL CITRATE (PF) 100 MCG/2ML IJ SOLN
INTRAMUSCULAR | Status: AC
  Filled 2018-05-08: qty 2

## 2018-05-08 MED ORDER — HYDROMORPHONE HCL 1 MG/ML IJ SOLN
0.5000 mg | INTRAMUSCULAR | Status: DC | PRN
  Administered 2018-05-08 (×2): 0.5 mg via INTRAVENOUS

## 2018-05-08 MED ORDER — FENTANYL CITRATE (PF) 100 MCG/2ML IJ SOLN
INTRAMUSCULAR | Status: DC | PRN
  Administered 2018-05-08 (×2): 100 ug via INTRAVENOUS

## 2018-05-08 MED ORDER — MIDAZOLAM HCL 2 MG/2ML IJ SOLN
INTRAMUSCULAR | Status: DC | PRN
  Administered 2018-05-08: 2 mg via INTRAVENOUS

## 2018-05-08 MED ORDER — KETOROLAC TROMETHAMINE 30 MG/ML IJ SOLN
INTRAMUSCULAR | Status: DC | PRN
  Administered 2018-05-08: 30 mg via INTRAVENOUS

## 2018-05-08 MED ORDER — LACTATED RINGERS IV SOLN
INTRAVENOUS | Status: DC
  Administered 2018-05-08: 125 mL/h via INTRAVENOUS

## 2018-05-08 SURGICAL SUPPLY — 14 items
ABLATOR SURESOUND NOVASURE (ABLATOR) IMPLANT
CANISTER SUCT 3000ML PPV (MISCELLANEOUS) ×3 IMPLANT
CATH ROBINSON RED A/P 16FR (CATHETERS) ×3 IMPLANT
DILATOR CANAL MILEX (MISCELLANEOUS) IMPLANT
GLOVE BIOGEL PI IND STRL 7.0 (GLOVE) ×4 IMPLANT
GLOVE BIOGEL PI INDICATOR 7.0 (GLOVE) ×2
GLOVE ECLIPSE 6.5 STRL STRAW (GLOVE) ×3 IMPLANT
GOWN STRL REUS W/TWL LRG LVL3 (GOWN DISPOSABLE) ×6 IMPLANT
PACK VAGINAL MINOR WOMEN LF (CUSTOM PROCEDURE TRAY) ×3 IMPLANT
PAD OB MATERNITY 4.3X12.25 (PERSONAL CARE ITEMS) ×3 IMPLANT
SET GENESYS HTA PROCERVA (MISCELLANEOUS) ×3 IMPLANT
TOWEL OR 17X24 6PK STRL BLUE (TOWEL DISPOSABLE) ×6 IMPLANT
TUBING AQUILEX INFLOW (TUBING) ×3 IMPLANT
TUBING AQUILEX OUTFLOW (TUBING) ×3 IMPLANT

## 2018-05-08 NOTE — Transfer of Care (Signed)
Immediate Anesthesia Transfer of Care Note  Patient: [name]  Procedure(s) Performed: DILATATION AND CURETTAGE /HYSTEROSCOPY WITH HTA (N/A Vagina )  Patient Location: PACU  Anesthesia Type:General  Level of Consciousness: awake, alert  and oriented  Airway & Oxygen Therapy: Patient Spontanous Breathing and Patient connected to nasal cannula oxygen  Post-op Assessment: Report given to RN and Post -op Vital signs reviewed and stable  Post vital signs: Reviewed and stable  Last Vitals:  Vitals Value Taken Time  BP    Temp    Pulse 78 05/08/2018  5:09 PM  Resp 13 05/08/2018  5:09 PM  SpO2 97 % 05/08/2018  5:09 PM  Vitals shown include unvalidated device data.  Last Pain:  Vitals:   05/08/18 1424  TempSrc: Oral  PainSc: 0-No pain      Patients Stated Pain Goal: 4 (99/23/41 4436)  Complications: No apparent anesthesia complications

## 2018-05-08 NOTE — Interval H&P Note (Signed)
History and Physical Interval Note:  05/08/2018 3:26 PM  [name]  has presented today for surgery, with the diagnosis of N93.9 Abnormal bleeding in menstraul cycle  The various methods of treatment have been discussed with the patient and family. After consideration of risks, benefits and other options for treatment, the patient has consented to  Procedure(s): DILATATION & CURETTAGE/HYSTEROSCOPY WITH NOVASURE ABLATION (N/A) or possible HTA ablation as a surgical intervention .  The patient's history has been reviewed, patient examined, no change in status, stable for surgery.  I have reviewed the patient's chart and labs.  Questions were answered to the patient's satisfaction.     [name]

## 2018-05-08 NOTE — Discharge Instructions (Addendum)
HOME INSTRUCTIONS  Please note any unusual or excessive bleeding, pain, swelling. Mild dizziness or drowsiness are normal for about 24 hours after surgery.   Shower when comfortable  Restrictions: No driving for 24 hours or while taking pain medications.  Activity:  No heavy lifting (> 10 lbs), nothing in vagina (no tampons, douching, or intercourse) x 2 weeks; no tub baths for 2 weeks Vaginal spotting is expected but if your bleeding is heavy, period like,  please call the office    Diet:  You may return to your regular diet.  Do not eat large meals.  Eat small frequent meals throughout the day.  Continue to drink a good amount of water at least 6-8 glasses of water per day, hydration is very important for the healing process.  Pain Management: Take Motrin and/or Tylenol as prescribed/needed for pain.  Always take prescription pain medication with food, it may cause constipation, increase fluids and fiber and you may want to take an over-the-counter stool softener like Colace as needed up to 2x a day.    Alcohol -- Avoid for 24 hours and while taking pain medications.  Nausea: Take sips of ginger ale or soda  Fever -- Call physician if temperature over 101 degrees  Follow up:  If you do not already have a follow up appointment scheduled, please call the office at [phone].  If you experience fever (a temperature greater than 100.4), pain unrelieved by pain medication, shortness of breath, swelling of a single leg, or any other symptoms which are concerning to you please the office immediately.   Post Anesthesia Home Care Instructions  NO IBUPROFEN PRODUCTS UNTIL: 10:30 PM TONIGHT   Activity: Get plenty of rest for the remainder of the day. A responsible individual must stay with you for 24 hours following the procedure.  For the next 24 hours, DO NOT: -Drive a car -Paediatric nurse -Drink alcoholic beverages -Take any medication unless instructed by your physician -Make  any legal decisions or sign important papers.  Meals: Start with liquid foods such as gelatin or soup. Progress to regular foods as tolerated. Avoid greasy, spicy, heavy foods. If nausea and/or vomiting occur, drink only clear liquids until the nausea and/or vomiting subsides. Call your physician if vomiting continues.  Special Instructions/Symptoms: Your throat may feel dry or sore from the anesthesia or the breathing tube placed in your throat during surgery. If this causes discomfort, gargle with warm salt water. The discomfort should disappear within 24 hours.  If you had a scopolamine patch placed behind your ear for the management of post- operative nausea and/or vomiting:  1. The medication in the patch is effective for 72 hours, after which it should be removed.  Wrap patch in a tissue and discard in the trash. Wash hands thoroughly with soap and water. 2. You may remove the patch earlier than 72 hours if you experience unpleasant side effects which may include dry mouth, dizziness or visual disturbances. 3. Avoid touching the patch. Wash your hands with soap and water after contact with the patch.

## 2018-05-08 NOTE — Op Note (Signed)
Operative Report  PreOp: Abnormal uterine bleeding PostOp: same Procedure:  Hysteroscopy, Dilation and Curettage, Endometrial ablation Surgeon: Dr. Janyth Pupa Anesthesia: General Complications:none EBL: 46mL UOP: 20cc IVF:1000cc  Findings: 6.5cm anteverted uterus  Specimens: endometrial curettings  Procedure: The patient was taken to the operating room where she underwent general anesthesia without difficulty. The patient was placed in a low lithotomy position using Allen stirrups. She was prepped and draped in the normal sterile fashion. The bladder was drained using a red rubber urethral catheter. A sterile speculum was inserted into the vagina. A single tooth tenaculum was placed on the anterior lip of the cervix. The uterus was then sounded to 6.5. The cervical length was measured at 3cm.  Uterine cavity measured 3.5cm- making her uterine cavity too small for Novasure.  Plan was transitioned to HTA ablation.  The endocervical canal was then serially dilated to 17French using Hank dilators.  The diagnostic hysteroscope was then inserted without difficulty and noted to have the findings as listed above. The hysteroscope was removed and sharp curettage was performed. The tissue was sent to pathology.   Attention was then turned to the HTA system. The system was set up according to manufacture instructions. Cavity assessment was performed and passed. The device was then activated and global ablation was observed.  All instrument were then removed. Hemostasis was observed at the cervical site.  The patient was repositioned to the supine position. The patient tolerated the procedure without any complications and taken to recovery in stable condition.   Janyth Pupa, DO [phone] (pager) [phone] (office)

## 2018-05-08 NOTE — Anesthesia Preprocedure Evaluation (Addendum)
Anesthesia Evaluation  Patient identified by MRN, date of birth, ID band Patient awake    Reviewed: Allergy & Precautions, NPO status , Patient's Chart, lab work & pertinent test results  Airway Mallampati: III  TM Distance: >3 FB Neck ROM: Full    Dental no notable dental hx. (+) Teeth Intact   Pulmonary asthma , former smoker,    Pulmonary exam normal breath sounds clear to auscultation       Cardiovascular negative cardio ROS Normal cardiovascular exam Rhythm:Regular Rate:Normal     Neuro/Psych  Headaches, Anxiety    GI/Hepatic negative GI ROS, Neg liver ROS,   Endo/Other  Morbid obesity  Renal/GU negative Renal ROS  negative genitourinary   Musculoskeletal   Abdominal (+) + obese,   Peds  Hematology negative hematology ROS (+)   Anesthesia Other Findings   Reproductive/Obstetrics AUB Menorrhagia                            Anesthesia Physical Anesthesia Plan  ASA: III  Anesthesia Plan: General   Post-op Pain Management:    Induction: Intravenous  PONV Risk Score and Plan: Scopolamine patch - Pre-op, Midazolam, Dexamethasone, Ondansetron and Treatment may vary due to age or medical condition  Airway Management Planned: LMA  Additional Equipment:   Intra-op Plan:   Post-operative Plan: Extubation in OR  Informed Consent: I have reviewed the patients History and Physical, chart, labs and discussed the procedure including the risks, benefits and alternatives for the proposed anesthesia with the patient or authorized representative who has indicated his/her understanding and acceptance.   Dental advisory given  Plan Discussed with: Anesthesiologist, CRNA and Surgeon  Anesthesia Plan Comments:         Anesthesia Quick Evaluation

## 2018-05-08 NOTE — Anesthesia Procedure Notes (Signed)
Procedure Name: LMA Insertion Date/Time: 05/08/2018 4:18 PM Performed by: Jonna Munro, CRNA Pre-anesthesia Checklist: Patient identified, Emergency Drugs available, Suction available, Patient being monitored and Timeout performed Patient Re-evaluated:Patient Re-evaluated prior to induction Oxygen Delivery Method: Circle system utilized Preoxygenation: Pre-oxygenation with 100% oxygen Induction Type: IV induction LMA: LMA inserted LMA Size: 4.0 Tube type: Oral Number of attempts: 1 Placement Confirmation: positive ETCO2 and breath sounds checked- equal and bilateral Tube secured with: Tape Dental Injury: Teeth and Oropharynx as per pre-operative assessment

## 2018-05-09 ENCOUNTER — Encounter (HOSPITAL_COMMUNITY): Payer: Self-pay | Admitting: Obstetrics & Gynecology

## 2018-05-09 NOTE — Anesthesia Postprocedure Evaluation (Signed)
Anesthesia Post Note  Patient: [name]  Procedure(s) Performed: DILATATION AND CURETTAGE /HYSTEROSCOPY WITH HTA (N/A Vagina )     Patient location during evaluation: PACU Anesthesia Type: General Level of consciousness: awake and alert Pain management: pain level controlled Vital Signs Assessment: post-procedure vital signs reviewed and stable Respiratory status: spontaneous breathing, nonlabored ventilation and respiratory function stable Cardiovascular status: blood pressure returned to baseline and stable Postop Assessment: no apparent nausea or vomiting Anesthetic complications: no    Last Vitals:  Vitals:   05/08/18 1845 05/08/18 1935  BP:  (!) 143/80  Pulse:  61  Resp:  18  Temp: 37.2 C 36.6 C  SpO2:  98%    Last Pain:  Vitals:   05/09/18 1417  TempSrc:   PainSc: 2    Pain Goal: Patients Stated Pain Goal: 4 (05/08/18 1845)               Lynda Rainwater

## 2018-06-11 ENCOUNTER — Emergency Department (HOSPITAL_COMMUNITY)
Admission: EM | Admit: 2018-06-11 | Discharge: 2018-06-11 | Disposition: A | Discharge status: Home / Self Care | Payer: No Typology Code available for payment source | Attending: Emergency Medicine | Admitting: Emergency Medicine

## 2018-06-11 ENCOUNTER — Emergency Department (HOSPITAL_COMMUNITY): Payer: No Typology Code available for payment source

## 2018-06-11 ENCOUNTER — Other Ambulatory Visit: Payer: Self-pay

## 2018-06-11 ENCOUNTER — Encounter (HOSPITAL_COMMUNITY): Payer: Self-pay | Admitting: Emergency Medicine

## 2018-06-11 DIAGNOSIS — J45909 Unspecified asthma, uncomplicated: Secondary | ICD-10-CM | POA: Diagnosis not present

## 2018-06-11 DIAGNOSIS — Z91040 Latex allergy status: Secondary | ICD-10-CM | POA: Diagnosis not present

## 2018-06-11 DIAGNOSIS — Y9389 Activity, other specified: Secondary | ICD-10-CM | POA: Diagnosis not present

## 2018-06-11 DIAGNOSIS — W2210XA Striking against or struck by unspecified automobile airbag, initial encounter: Secondary | ICD-10-CM | POA: Diagnosis not present

## 2018-06-11 DIAGNOSIS — T07XXXA Unspecified multiple injuries, initial encounter: Secondary | ICD-10-CM

## 2018-06-11 DIAGNOSIS — Z87891 Personal history of nicotine dependence: Secondary | ICD-10-CM | POA: Diagnosis not present

## 2018-06-11 DIAGNOSIS — S52501A Unspecified fracture of the lower end of right radius, initial encounter for closed fracture: Secondary | ICD-10-CM | POA: Diagnosis not present

## 2018-06-11 DIAGNOSIS — Y998 Other external cause status: Secondary | ICD-10-CM | POA: Insufficient documentation

## 2018-06-11 DIAGNOSIS — Y92410 Unspecified street and highway as the place of occurrence of the external cause: Secondary | ICD-10-CM | POA: Insufficient documentation

## 2018-06-11 DIAGNOSIS — S6991XA Unspecified injury of right wrist, hand and finger(s), initial encounter: Secondary | ICD-10-CM | POA: Diagnosis present

## 2018-06-11 HISTORY — DX: Unspecified multiple injuries, initial encounter: T07.XXXA

## 2018-06-11 HISTORY — DX: Unspecified fracture of the lower end of right radius, initial encounter for closed fracture: S52.501A

## 2018-06-11 MED ORDER — OXYCODONE-ACETAMINOPHEN 5-325 MG PO TABS
1.0000 | ORAL_TABLET | Freq: Once | ORAL | Status: AC
  Administered 2018-06-11: 1 via ORAL
  Filled 2018-06-11: qty 1

## 2018-06-11 MED ORDER — MORPHINE SULFATE (PF) 4 MG/ML IV SOLN
4.0000 mg | Freq: Once | INTRAVENOUS | Status: DC
  Filled 2018-06-11: qty 1

## 2018-06-11 MED ORDER — OXYCODONE-ACETAMINOPHEN 5-325 MG PO TABS: 1.0000 | ORAL_TABLET | Freq: Four times a day (QID) | ORAL | 0 refills | Status: DC | PRN

## 2018-06-11 NOTE — ED Notes (Signed)
Pt transported to X-ray from waiting room. X-ray noted to bring pt to A12.
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
## 2018-06-11 NOTE — ED Notes (Signed)
Paged ortho tech 
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
## 2018-06-11 NOTE — Discharge Instructions (Addendum)
Please read attached information. If you experience any new or worsening signs or symptoms please return to the emergency room for evaluation. Please follow-up with your primary care provider or specialist as discussed. Please use medication prescribed only as directed and discontinue taking if you have any concerning signs or symptoms.   °

## 2018-06-11 NOTE — ED Notes (Signed)
Ice applied

## 2018-06-13 ENCOUNTER — Other Ambulatory Visit: Payer: Self-pay | Admitting: Orthopedic Surgery

## 2018-06-13 ENCOUNTER — Other Ambulatory Visit: Payer: Self-pay

## 2018-06-13 ENCOUNTER — Encounter (HOSPITAL_BASED_OUTPATIENT_CLINIC_OR_DEPARTMENT_OTHER): Payer: Self-pay | Admitting: *Deleted

## 2018-06-13 DIAGNOSIS — S62002A Unspecified fracture of navicular [scaphoid] bone of left wrist, initial encounter for closed fracture: Secondary | ICD-10-CM

## 2018-06-14 ENCOUNTER — Ambulatory Visit
Admission: RE | Admit: 2018-06-14 | Discharge: 2018-06-14 | Disposition: A | Discharge status: Home / Self Care | Payer: Self-pay | Source: Ambulatory Visit | Attending: Orthopedic Surgery | Admitting: Orthopedic Surgery

## 2018-06-14 DIAGNOSIS — S62002A Unspecified fracture of navicular [scaphoid] bone of left wrist, initial encounter for closed fracture: Secondary | ICD-10-CM

## 2018-06-18 ENCOUNTER — Ambulatory Visit (HOSPITAL_BASED_OUTPATIENT_CLINIC_OR_DEPARTMENT_OTHER): Payer: No Typology Code available for payment source | Admitting: Anesthesiology

## 2018-06-18 ENCOUNTER — Encounter (HOSPITAL_BASED_OUTPATIENT_CLINIC_OR_DEPARTMENT_OTHER): Payer: Self-pay | Admitting: *Deleted

## 2018-06-18 ENCOUNTER — Ambulatory Visit (HOSPITAL_BASED_OUTPATIENT_CLINIC_OR_DEPARTMENT_OTHER)
Admission: RE | Admit: 2018-06-18 | Discharge: 2018-06-18 | Disposition: A | Discharge status: Home / Self Care | Payer: No Typology Code available for payment source | Source: Ambulatory Visit | Attending: Orthopedic Surgery | Admitting: Orthopedic Surgery

## 2018-06-18 ENCOUNTER — Other Ambulatory Visit: Payer: Self-pay

## 2018-06-18 ENCOUNTER — Encounter (HOSPITAL_BASED_OUTPATIENT_CLINIC_OR_DEPARTMENT_OTHER)
Admission: RE | Disposition: A | Discharge status: Home / Self Care | Payer: Self-pay | Source: Ambulatory Visit | Attending: Orthopedic Surgery

## 2018-06-18 DIAGNOSIS — S62145A Nondisplaced fracture of body of hamate [unciform] bone, left wrist, initial encounter for closed fracture: Secondary | ICD-10-CM | POA: Diagnosis not present

## 2018-06-18 DIAGNOSIS — F419 Anxiety disorder, unspecified: Secondary | ICD-10-CM | POA: Diagnosis not present

## 2018-06-18 DIAGNOSIS — Z87891 Personal history of nicotine dependence: Secondary | ICD-10-CM | POA: Insufficient documentation

## 2018-06-18 DIAGNOSIS — Z79899 Other long term (current) drug therapy: Secondary | ICD-10-CM | POA: Diagnosis not present

## 2018-06-18 DIAGNOSIS — Y92410 Unspecified street and highway as the place of occurrence of the external cause: Secondary | ICD-10-CM | POA: Insufficient documentation

## 2018-06-18 DIAGNOSIS — S52571A Other intraarticular fracture of lower end of right radius, initial encounter for closed fracture: Secondary | ICD-10-CM | POA: Insufficient documentation

## 2018-06-18 HISTORY — DX: Family history of other specified conditions: Z84.89

## 2018-06-18 HISTORY — PX: OPEN REDUCTION INTERNAL FIXATION (ORIF) DISTAL RADIAL FRACTURE: SHX5989

## 2018-06-18 HISTORY — DX: Dental restoration status: Z98.811

## 2018-06-18 HISTORY — DX: Migraine, unspecified, not intractable, without status migrainosus: G43.909

## 2018-06-18 HISTORY — DX: Abnormal uterine and vaginal bleeding, unspecified: N93.9

## 2018-06-18 HISTORY — DX: Unspecified multiple injuries, initial encounter: T07.XXXA

## 2018-06-18 HISTORY — DX: Unspecified fracture of the lower end of right radius, initial encounter for closed fracture: S52.501A

## 2018-06-18 SURGERY — OPEN REDUCTION INTERNAL FIXATION (ORIF) DISTAL RADIUS FRACTURE
Anesthesia: Regional | Site: Wrist | Laterality: Right

## 2018-06-18 MED ORDER — FENTANYL CITRATE (PF) 100 MCG/2ML IJ SOLN
INTRAMUSCULAR | Status: AC
  Filled 2018-06-18: qty 2

## 2018-06-18 MED ORDER — CHLORHEXIDINE GLUCONATE 4 % EX LIQD: 60.0000 mL | Freq: Once | CUTANEOUS | Status: DC

## 2018-06-18 MED ORDER — MIDAZOLAM HCL 5 MG/5ML IJ SOLN
INTRAMUSCULAR | Status: DC | PRN
  Administered 2018-06-18: 2 mg via INTRAVENOUS

## 2018-06-18 MED ORDER — LIDOCAINE HCL (CARDIAC) PF 100 MG/5ML IV SOSY
PREFILLED_SYRINGE | INTRAVENOUS | Status: DC | PRN
  Administered 2018-06-18: 100 mg via INTRAVENOUS

## 2018-06-18 MED ORDER — LACTATED RINGERS IV SOLN
INTRAVENOUS | Status: DC
  Administered 2018-06-18 (×2): via INTRAVENOUS

## 2018-06-18 MED ORDER — PHENYLEPHRINE 40 MCG/ML (10ML) SYRINGE FOR IV PUSH (FOR BLOOD PRESSURE SUPPORT)
PREFILLED_SYRINGE | INTRAVENOUS | Status: AC
  Filled 2018-06-18: qty 10

## 2018-06-18 MED ORDER — ONDANSETRON HCL 4 MG/2ML IJ SOLN: 4.0000 mg | Freq: Four times a day (QID) | INTRAMUSCULAR | Status: DC | PRN

## 2018-06-18 MED ORDER — DEXAMETHASONE SODIUM PHOSPHATE 10 MG/ML IJ SOLN
INTRAMUSCULAR | Status: AC
  Filled 2018-06-18: qty 1

## 2018-06-18 MED ORDER — OXYCODONE HCL 5 MG/5ML PO SOLN: 5.0000 mg | Freq: Once | ORAL | Status: DC | PRN

## 2018-06-18 MED ORDER — CELECOXIB 200 MG PO CAPS: 200.0000 mg | ORAL_CAPSULE | Freq: Every day | ORAL | 1 refills | Status: AC

## 2018-06-18 MED ORDER — FENTANYL CITRATE (PF) 100 MCG/2ML IJ SOLN
50.0000 ug | INTRAMUSCULAR | Status: DC | PRN
  Administered 2018-06-18: 50 ug via INTRAVENOUS

## 2018-06-18 MED ORDER — SCOPOLAMINE 1 MG/3DAYS TD PT72
MEDICATED_PATCH | TRANSDERMAL | Status: AC
  Filled 2018-06-18: qty 1

## 2018-06-18 MED ORDER — ROPIVACAINE HCL 7.5 MG/ML IJ SOLN
INTRAMUSCULAR | Status: DC | PRN
  Administered 2018-06-18: 20 mL via PERINEURAL

## 2018-06-18 MED ORDER — PROPOFOL 10 MG/ML IV BOLUS
INTRAVENOUS | Status: DC | PRN
  Administered 2018-06-18: 200 mg via INTRAVENOUS

## 2018-06-18 MED ORDER — DEXAMETHASONE SODIUM PHOSPHATE 10 MG/ML IJ SOLN
INTRAMUSCULAR | Status: DC | PRN
  Administered 2018-06-18: 10 mg via INTRAVENOUS

## 2018-06-18 MED ORDER — MIDAZOLAM HCL 2 MG/2ML IJ SOLN
1.0000 mg | INTRAMUSCULAR | Status: DC | PRN
  Administered 2018-06-18: 2 mg via INTRAVENOUS

## 2018-06-18 MED ORDER — OXYCODONE-ACETAMINOPHEN 5-325 MG PO TABS: ORAL_TABLET | ORAL | 0 refills | Status: DC

## 2018-06-18 MED ORDER — SUCCINYLCHOLINE CHLORIDE 200 MG/10ML IV SOSY
PREFILLED_SYRINGE | INTRAVENOUS | Status: AC
  Filled 2018-06-18: qty 10

## 2018-06-18 MED ORDER — CEFAZOLIN SODIUM-DEXTROSE 2-4 GM/100ML-% IV SOLN
INTRAVENOUS | Status: AC
  Filled 2018-06-18: qty 100

## 2018-06-18 MED ORDER — CEFAZOLIN SODIUM-DEXTROSE 2-4 GM/100ML-% IV SOLN
2.0000 g | INTRAVENOUS | Status: AC
  Administered 2018-06-18: 2 g via INTRAVENOUS

## 2018-06-18 MED ORDER — ONDANSETRON HCL 4 MG/2ML IJ SOLN
INTRAMUSCULAR | Status: AC
  Filled 2018-06-18: qty 2

## 2018-06-18 MED ORDER — ONDANSETRON HCL 4 MG/2ML IJ SOLN
INTRAMUSCULAR | Status: DC | PRN
  Administered 2018-06-18: 4 mg via INTRAVENOUS

## 2018-06-18 MED ORDER — FENTANYL CITRATE (PF) 100 MCG/2ML IJ SOLN
INTRAMUSCULAR | Status: DC | PRN
  Administered 2018-06-18: 100 ug via INTRAVENOUS

## 2018-06-18 MED ORDER — MIDAZOLAM HCL 2 MG/2ML IJ SOLN
INTRAMUSCULAR | Status: AC
  Filled 2018-06-18: qty 2

## 2018-06-18 MED ORDER — OXYCODONE HCL 5 MG PO TABS: 5.0000 mg | ORAL_TABLET | Freq: Once | ORAL | Status: DC | PRN

## 2018-06-18 MED ORDER — EPHEDRINE SULFATE 50 MG/ML IJ SOLN
INTRAMUSCULAR | Status: AC
  Filled 2018-06-18: qty 1

## 2018-06-18 MED ORDER — FENTANYL CITRATE (PF) 100 MCG/2ML IJ SOLN: 25.0000 ug | INTRAMUSCULAR | Status: DC | PRN

## 2018-06-18 MED ORDER — SCOPOLAMINE 1 MG/3DAYS TD PT72
1.0000 | MEDICATED_PATCH | Freq: Once | TRANSDERMAL | Status: AC | PRN
  Administered 2018-06-18: 1 via TRANSDERMAL

## 2018-06-18 MED ORDER — LIDOCAINE HCL (CARDIAC) PF 100 MG/5ML IV SOSY
PREFILLED_SYRINGE | INTRAVENOUS | Status: AC
  Filled 2018-06-18: qty 5

## 2018-06-18 SURGICAL SUPPLY — 58 items
BANDAGE ACE 3X5.8 VEL STRL LF (GAUZE/BANDAGES/DRESSINGS) ×2 IMPLANT
BIT DRILL 2.0 LNG QUCK RELEASE (BIT) ×1 IMPLANT
BIT DRILL 2.8X5 QR DISP (BIT) ×2 IMPLANT
BLADE SURG 15 STRL LF DISP TIS (BLADE) ×2 IMPLANT
BLADE SURG 15 STRL SS (BLADE) ×2
BNDG ESMARK 4X9 LF (GAUZE/BANDAGES/DRESSINGS) ×2 IMPLANT
BNDG GAUZE ELAST 4 BULKY (GAUZE/BANDAGES/DRESSINGS) ×2 IMPLANT
BNDG PLASTER X FAST 3X3 WHT LF (CAST SUPPLIES) ×20 IMPLANT
CHLORAPREP W/TINT 26ML (MISCELLANEOUS) ×2 IMPLANT
CORD BIPOLAR FORCEPS 12FT (ELECTRODE) ×2 IMPLANT
COVER BACK TABLE 60X90IN (DRAPES) ×2 IMPLANT
COVER MAYO STAND STRL (DRAPES) ×2 IMPLANT
CUFF TOURNIQUET SINGLE 18IN (TOURNIQUET CUFF) IMPLANT
CUFF TOURNIQUET SINGLE 24IN (TOURNIQUET CUFF) IMPLANT
DRAPE EXTREMITY T 121X128X90 (DRAPE) ×2 IMPLANT
DRAPE OEC MINIVIEW 54X84 (DRAPES) ×2 IMPLANT
DRAPE SURG 17X23 STRL (DRAPES) ×2 IMPLANT
DRILL 2.0 LNG QUICK RELEASE (BIT) ×2
GAUZE SPONGE 4X4 12PLY STRL (GAUZE/BANDAGES/DRESSINGS) ×2 IMPLANT
GAUZE XEROFORM 1X8 LF (GAUZE/BANDAGES/DRESSINGS) ×2 IMPLANT
GLOVE BIO SURGEON STRL SZ7.5 (GLOVE) IMPLANT
GLOVE BIOGEL PI IND STRL 6.5 (GLOVE) ×1 IMPLANT
GLOVE BIOGEL PI IND STRL 7.0 (GLOVE) ×2 IMPLANT
GLOVE BIOGEL PI IND STRL 8 (GLOVE) ×1 IMPLANT
GLOVE BIOGEL PI IND STRL 8.5 (GLOVE) ×1 IMPLANT
GLOVE BIOGEL PI INDICATOR 6.5 (GLOVE) ×1
GLOVE BIOGEL PI INDICATOR 7.0 (GLOVE) ×2
GLOVE BIOGEL PI INDICATOR 8 (GLOVE) ×1
GLOVE BIOGEL PI INDICATOR 8.5 (GLOVE) ×1
GLOVE SURG ORTHO 8.0 STRL STRW (GLOVE) IMPLANT
GOWN STRL REUS W/ TWL LRG LVL3 (GOWN DISPOSABLE) ×1 IMPLANT
GOWN STRL REUS W/TWL LRG LVL3 (GOWN DISPOSABLE) ×1
GOWN STRL REUS W/TWL XL LVL3 (GOWN DISPOSABLE) ×4 IMPLANT
GUIDEWIRE ORTHO 0.054X6 (WIRE) ×6 IMPLANT
NEEDLE HYPO 25X1 1.5 SAFETY (NEEDLE) IMPLANT
NS IRRIG 1000ML POUR BTL (IV SOLUTION) ×2 IMPLANT
PACK BASIN DAY SURGERY FS (CUSTOM PROCEDURE TRAY) ×2 IMPLANT
PAD CAST 3X4 CTTN HI CHSV (CAST SUPPLIES) ×1 IMPLANT
PADDING CAST COTTON 3X4 STRL (CAST SUPPLIES) ×1
PLATE R NARROW PROC VDR (Plate) ×2 IMPLANT
SCREW ACTK 2 NL HEX 3.5.11 (Screw) ×2 IMPLANT
SCREW CORT FT 18X2.3XLCK HEX (Screw) ×2 IMPLANT
SCREW CORTICAL LOCKING 2.3X16M (Screw) ×3 IMPLANT
SCREW CORTICAL LOCKING 2.3X18M (Screw) ×3 IMPLANT
SCREW FX16X2.3XLCK SMTH NS CRT (Screw) ×3 IMPLANT
SCREW FX18X2.3XSMTH LCK NS CRT (Screw) ×1 IMPLANT
SCREW NLCKG 13 3.5X13 HEXA (Screw) ×1 IMPLANT
SCREW NON-LOCK 3.5X13 (Screw) ×2 IMPLANT
SCREW NONLOCK HEX 3.5X12 (Screw) ×2 IMPLANT
SLEEVE SCD COMPRESS KNEE MED (MISCELLANEOUS) ×2 IMPLANT
STOCKINETTE 4X48 STRL (DRAPES) ×2 IMPLANT
SUT ETHILON 4 0 PS 2 18 (SUTURE) ×2 IMPLANT
SUT VICRYL 4-0 PS2 18IN ABS (SUTURE) ×2 IMPLANT
SYR BULB 3OZ (MISCELLANEOUS) ×2 IMPLANT
SYR CONTROL 10ML LL (SYRINGE) IMPLANT
TOWEL GREEN STERILE FF (TOWEL DISPOSABLE) ×4 IMPLANT
TOWEL OR NON WOVEN STRL DISP B (DISPOSABLE) ×2 IMPLANT
UNDERPAD 30X30 (UNDERPADS AND DIAPERS) ×2 IMPLANT
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
## 2018-06-18 NOTE — Op Note (Signed)
I assisted Surgeon(s) and Role:    * Leanora Cover, MD - Primary    Daryll Brod, MD - Assisting on the Procedure(s): OPEN REDUCTION INTERNAL FIXATION RIGHT (ORIF) DISTAL RADIAL FRACTURE on 06/18/2018.  I provided assistance on this case as follows: setup, approach, debridement of the fracture, reduction, stabilization and fixation of the fracture, closure of the wound and application of the dressing and splint.  Electronically signed by: Wynonia Sours, MD Date: 06/18/2018 Time: 4:31 PM

## 2018-06-18 NOTE — Anesthesia Preprocedure Evaluation (Signed)
Anesthesia Evaluation  Patient identified by MRN, date of birth, ID band Patient awake    Reviewed: Allergy & Precautions, H&P , NPO status , Patient's Chart, lab work & pertinent test results  Airway Mallampati: II   Neck ROM: full    Dental   Pulmonary asthma , former smoker,    breath sounds clear to auscultation       Cardiovascular negative cardio ROS   Rhythm:regular Rate:Normal     Neuro/Psych  Headaches,    GI/Hepatic   Endo/Other  Morbid obesity  Renal/GU      Musculoskeletal Right distal radius fx   Abdominal   Peds  Hematology   Anesthesia Other Findings   Reproductive/Obstetrics                             Anesthesia Physical Anesthesia Plan  ASA: II  Anesthesia Plan: General and Regional   Post-op Pain Management:  Regional for Post-op pain   Induction: Intravenous  PONV Risk Score and Plan: 3 and Ondansetron, Dexamethasone, Midazolam and Treatment may vary due to age or medical condition  Airway Management Planned: LMA  Additional Equipment:   Intra-op Plan:   Post-operative Plan:   Informed Consent: I have reviewed the patients History and Physical, chart, labs and discussed the procedure including the risks, benefits and alternatives for the proposed anesthesia with the patient or authorized representative who has indicated his/her understanding and acceptance.     Plan Discussed with: CRNA, Anesthesiologist and Surgeon  Anesthesia Plan Comments:         Anesthesia Quick Evaluation

## 2018-06-18 NOTE — Anesthesia Procedure Notes (Signed)
Anesthesia Regional Block: Supraclavicular block   Pre-Anesthetic Checklist: ,, timeout performed, Correct Patient, Correct Site, Correct Laterality, Correct Procedure, Correct Position, site marked, Risks and benefits discussed,  Surgical consent,  Pre-op evaluation,  At surgeon's request and post-op pain management  Laterality: Right  Prep: chloraprep       Needles:  Injection technique: Single-shot  Needle Type: Echogenic Stimulator Needle     Needle Length: 5cm  Needle Gauge: 22     Additional Needles:   Procedures:, nerve stimulator,,,,,,,   Nerve Stimulator or Paresthesia:  Response: biceps flexion, 0.45 mA,   Additional Responses:   Narrative:  Start time: 06/18/2018 2:14 PM End time: 06/18/2018 2:23 PM Injection made incrementally with aspirations every 5 mL.  Performed by: Personally  Anesthesiologist: Albertha Ghee, MD  Additional Notes: Functioning IV was confirmed and monitors were applied.  A 60mm 22ga Arrow echogenic stimulator needle was used. Sterile prep and drape,hand hygiene and sterile gloves were used.  Negative aspiration and negative test dose prior to incremental administration of local anesthetic. The patient tolerated the procedure well.  Ultrasound guidance: relevent anatomy identified, needle position confirmed, local anesthetic spread visualized around nerve(s), vascular puncture avoided.  Image printed for medical record.

## 2018-06-18 NOTE — Anesthesia Postprocedure Evaluation (Addendum)
Anesthesia Post Note  Patient: [name]  Procedure(s) Performed: OPEN REDUCTION INTERNAL FIXATION RIGHT (ORIF) DISTAL RADIAL FRACTURE (Right Wrist)     Patient location during evaluation: PACU Anesthesia Type: Regional and General Level of consciousness: awake and alert Pain management: pain level controlled Vital Signs Assessment: post-procedure vital signs reviewed and stable Respiratory status: spontaneous breathing, nonlabored ventilation, respiratory function stable and patient connected to nasal cannula oxygen Cardiovascular status: blood pressure returned to baseline and stable Postop Assessment: no apparent nausea or vomiting Anesthetic complications: no    Last Vitals:  Vitals:   06/18/18 1656 06/18/18 1745  BP: 138/78 138/78  Pulse: 75   Resp: 16 20  Temp:  36.7 C  SpO2: 95%     Last Pain:  Vitals:   06/18/18 1656  TempSrc:   PainSc: 0-No pain                 Shalena Ezzell S

## 2018-06-18 NOTE — Discharge Instructions (Addendum)
° °  ° ° ° °[facility] Center Instructions °Hand Surgery ° °Wound Care: °Keep your hand elevated above the level of your heart.  Do not allow it to dangle by your side.  Keep the dressing dry and do not remove it unless your doctor advises you to do so.  He will usually change it at the time of your post-op visit.  Moving your fingers is advised to stimulate circulation but will depend on the site of your surgery.  If you have a splint applied, your doctor will advise you regarding movement. ° °Activity: °Do not drive or operate machinery today.  Rest today and then you may return to your normal activity and work as indicated by your physician. ° °Diet:  °Drink liquids today or eat a light diet.  You may resume a regular diet tomorrow.   ° °General expectations: °Pain for two to three days. °Fingers may become slightly swollen. ° °Call your doctor if any of the following occur: °Severe pain not relieved by pain medication. °Elevated temperature. °Dressing soaked with blood. °Inability to move fingers. °White or bluish color to fingers. ° ° °Regional Anesthesia Blocks ° °1. Numbness or the inability to move the "blocked" extremity may last from 3-48 hours after placement. The length of time depends on the medication injected and your individual response to the medication. If the numbness is not going away after 48 hours, call your surgeon. ° °2. The extremity that is blocked will need to be protected until the numbness is gone and the  Strength has returned. Because you cannot feel it, you will need to take extra care to avoid injury. Because it may be weak, you may have difficulty moving it or using it. You may not know what position it is in without looking at it while the block is in effect. ° °3. For blocks in the legs and feet, returning to weight bearing and walking needs to be done carefully. You will need to wait until the numbness is entirely gone and the strength has returned. You should be able to move your leg  and foot normally before you try and bear weight or walk. You will need someone to be with you when you first try to ensure you do not fall and possibly risk injury. ° °4. Bruising and tenderness at the needle site are common side effects and will resolve in a few days. ° °5. Persistent numbness or new problems with movement should be communicated to the surgeon or the [facility] ([phone])/ [facility] ([phone]). ° ° ° °Post Anesthesia Home Care Instructions ° °Activity: °Get plenty of rest for the remainder of the day. A responsible individual must stay with you for 24 hours following the procedure.  °For the next 24 hours, DO NOT: °-Drive a car °-Operate machinery °-Drink alcoholic beverages °-Take any medication unless instructed by your physician °-Make any legal decisions or sign important papers. ° °Meals: °Start with liquid foods such as gelatin or soup. Progress to regular foods as tolerated. Avoid greasy, spicy, heavy foods. If nausea and/or vomiting occur, drink only clear liquids until the nausea and/or vomiting subsides. Call your physician if vomiting continues. ° °Special Instructions/Symptoms: °Your throat may feel dry or sore from the anesthesia or the breathing tube placed in your throat during surgery. If this causes discomfort, gargle with warm salt water. The discomfort should disappear within 24 hours. ° °If you had a scopolamine patch placed behind your ear for   the management of post- operative nausea and/or vomiting: ° °1. The medication in the patch is effective for 72 hours, after which it should be removed.  Wrap patch in a tissue and discard in the trash. Wash hands thoroughly with soap and water. °2. You may remove the patch earlier than 72 hours if you experience unpleasant side effects which may include dry mouth, dizziness or visual disturbances. °3. Avoid touching the patch. Wash your hands with soap and water after contact with the patch. °  ° ° °

## 2018-06-18 NOTE — Anesthesia Procedure Notes (Signed)
Procedure Name: LMA Insertion Date/Time: 06/18/2018 3:28 PM Performed by: Marrianne Mood, CRNA Pre-anesthesia Checklist: Patient identified, Emergency Drugs available, Suction available, Patient being monitored and Timeout performed Patient Re-evaluated:Patient Re-evaluated prior to induction Oxygen Delivery Method: Circle system utilized Preoxygenation: Pre-oxygenation with 100% oxygen Induction Type: IV induction Ventilation: Mask ventilation without difficulty LMA: LMA inserted LMA Size: 4.0 Number of attempts: 1 Airway Equipment and Method: Bite block Placement Confirmation: positive ETCO2 Tube secured with: Tape Dental Injury: Teeth and Oropharynx as per pre-operative assessment

## 2018-06-18 NOTE — Transfer of Care (Signed)
Immediate Anesthesia Transfer of Care Note  Patient: [name]  Procedure(s) Performed: OPEN REDUCTION INTERNAL FIXATION RIGHT (ORIF) DISTAL RADIAL FRACTURE (Right Wrist)  Patient Location: PACU  Anesthesia Type:GA combined with regional for post-op pain  Level of Consciousness: awake and patient cooperative  Airway & Oxygen Therapy: Patient Spontanous Breathing and Patient connected to face mask oxygen  Post-op Assessment: Report given to RN and Post -op Vital signs reviewed and stable  Post vital signs: Reviewed and stable  Last Vitals:  Vitals Value Taken Time  BP    Temp    Pulse 89 06/18/2018  4:36 PM  Resp 33 06/18/2018  4:36 PM  SpO2 99 % 06/18/2018  4:36 PM  Vitals shown include unvalidated device data.  Last Pain:  Vitals:   06/18/18 1500  TempSrc:   PainSc: 0-No pain      Patients Stated Pain Goal: 2 (09/13/84 2778)  Complications: No apparent anesthesia complications

## 2018-06-18 NOTE — Progress Notes (Signed)
Assisted Dr. Hodierne with right, ultrasound guided, interscalene  block. Side rails up, monitors on throughout procedure. See vital signs in flow sheet. Tolerated Procedure well. 
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
## 2018-06-19 ENCOUNTER — Encounter (HOSPITAL_BASED_OUTPATIENT_CLINIC_OR_DEPARTMENT_OTHER): Payer: Self-pay | Admitting: Orthopedic Surgery

## 2018-10-16 ENCOUNTER — Encounter: Payer: Self-pay | Admitting: Adult Health

## 2018-10-16 ENCOUNTER — Ambulatory Visit: Payer: BC Managed Care – PPO | Admitting: Adult Health

## 2018-10-16 DIAGNOSIS — G44329 Chronic post-traumatic headache, not intractable: Secondary | ICD-10-CM

## 2018-10-16 MED ORDER — GABAPENTIN 100 MG PO CAPS: 100.0000 mg | ORAL_CAPSULE | Freq: Every day | ORAL | 5 refills | Status: DC
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
## 2018-10-16 NOTE — Progress Notes (Signed)
Personally  participated in, made any corrections needed, and agree with history, physical, neuro exam,assessment and plan as stated above.    Antonia Ahern, MD [facility] 

## 2018-10-16 NOTE — Patient Instructions (Addendum)
Your Plan:  Start Gabapentin 100 mg at bedtime Limit use of OTC medication such as Tylenol to avoid rebound headache CT head ordered If your symptoms worsen or you develop new symptoms please let us know.   Thank you for coming to see [place] at [facility]. I hope we have been able to provide you high quality care today.  You may receive a patient satisfaction survey over the next few weeks. We would appreciate your feedback and comments so that we may continue to improve ourselves and the health of our patients.  Gabapentin capsules or tablets What is this medicine? GABAPENTIN (GA ba pen tin) is used to control partial seizures in adults with epilepsy. It is also used to treat certain types of nerve pain. This medicine may be used for other purposes; ask your health care provider or pharmacist if you have questions. COMMON BRAND NAME(S): Active-PAC with Gabapentin, Gabarone, Neurontin What should I tell my health care provider before I take this medicine? They need to know if you have any of these conditions: -kidney disease -suicidal thoughts, plans, or attempt; a previous suicide attempt by you or a family member -an unusual or allergic reaction to gabapentin, other medicines, foods, dyes, or preservatives -pregnant or trying to get pregnant -breast-feeding How should I use this medicine? Take this medicine by mouth with a glass of water. Follow the directions on the prescription label. You can take it with or without food. If it upsets your stomach, take it with food.Take your medicine at regular intervals. Do not take it more often than directed. Do not stop taking except on your doctor's advice. If you are directed to break the 600 or 800 mg tablets in half as part of your dose, the extra half tablet should be used for the next dose. If you have not used the extra half tablet within 28 days, it should be thrown away. A special MedGuide will be given to you by the  pharmacist with each prescription and refill. Be sure to read this information carefully each time. Talk to your pediatrician regarding the use of this medicine in children. Special care may be needed. Overdosage: If you think you have taken too much of this medicine contact a poison control center or emergency room at once. NOTE: This medicine is only for you. Do not share this medicine with others. What if I miss a dose? If you miss a dose, take it as soon as you can. If it is almost time for your next dose, take only that dose. Do not take double or extra doses. What may interact with this medicine? Do not take this medicine with any of the following medications: -other gabapentin products This medicine may also interact with the following medications: -alcohol -antacids -antihistamines for allergy, cough and cold -certain medicines for anxiety or sleep -certain medicines for depression or psychotic disturbances -homatropine; hydrocodone -naproxen -narcotic medicines (opiates) for pain -phenothiazines like chlorpromazine, mesoridazine, prochlorperazine, thioridazine This list may not describe all possible interactions. Give your health care provider a list of all the medicines, herbs, non-prescription drugs, or dietary supplements you use. Also tell them if you smoke, drink alcohol, or use illegal drugs. Some items may interact with your medicine. What should I watch for while using this medicine? Visit your doctor or health care professional for regular checks on your progress. You may want to keep a record at home of how you feel your condition is responding to treatment. You may want  to share this information with your doctor or health care professional at each visit. You should contact your doctor or health care professional if your seizures get worse or if you have any new types of seizures. Do not stop taking this medicine or any of your seizure medicines unless instructed by your  doctor or health care professional. Stopping your medicine suddenly can increase your seizures or their severity. Wear a medical identification bracelet or chain if you are taking this medicine for seizures, and carry a card that lists all your medications. You may get drowsy, dizzy, or have blurred vision. Do not drive, use machinery, or do anything that needs mental alertness until you know how this medicine affects you. To reduce dizzy or fainting spells, do not sit or stand up quickly, especially if you are an older patient. Alcohol can increase drowsiness and dizziness. Avoid alcoholic drinks. Your mouth may get dry. Chewing sugarless gum or sucking hard candy, and drinking plenty of water will help. The use of this medicine may increase the chance of suicidal thoughts or actions. Pay special attention to how you are responding while on this medicine. Any worsening of mood, or thoughts of suicide or dying should be reported to your health care professional right away. Women who become pregnant while using this medicine may enroll in the [place] Pregnancy Registry by calling [id number]. This registry collects information about the safety of antiepileptic drug use during pregnancy. What side effects may I notice from receiving this medicine? Side effects that you should report to your doctor or health care professional as soon as possible: -allergic reactions like skin rash, itching or hives, swelling of the face, lips, or tongue -worsening of mood, thoughts or actions of suicide or dying Side effects that usually do not require medical attention (report to your doctor or health care professional if they continue or are bothersome): -constipation -difficulty walking or controlling muscle movements -dizziness -nausea -slurred speech -tiredness -tremors -weight gain This list may not describe all possible side effects. Call your doctor for medical advice about side  effects. You may report side effects to FDA at 1-800-FDA-1088. Where should I keep my medicine? Keep out of reach of children. This medicine may cause accidental overdose and death if it taken by other adults, children, or pets. Mix any unused medicine with a substance like cat litter or coffee grounds. Then throw the medicine away in a sealed container like a sealed bag or a coffee can with a lid. Do not use the medicine after the expiration date. Store at room temperature between 15 and 30 degrees C (59 and 86 degrees F). NOTE: This sheet is a summary. It may not cover all possible information. If you have questions about this medicine, talk to your doctor, pharmacist, or health care provider.  2018 Elsevier/Gold Standard (2014-01-02 15:26:50)

## 2018-10-22 ENCOUNTER — Telehealth: Payer: Self-pay | Admitting: Adult Health

## 2018-10-22 NOTE — Telephone Encounter (Signed)
[employer] Auth: [id number] (exp. 10/22/18 to 11/20/18) order sent to GI. They will reach out to the pt to schedule.

## 2018-10-30 ENCOUNTER — Ambulatory Visit
Admission: RE | Admit: 2018-10-30 | Discharge: 2018-10-30 | Disposition: A | Discharge status: Home / Self Care | Payer: BC Managed Care – PPO | Source: Ambulatory Visit | Attending: Adult Health | Admitting: Adult Health

## 2018-10-30 DIAGNOSIS — G44329 Chronic post-traumatic headache, not intractable: Secondary | ICD-10-CM

## 2018-11-05 ENCOUNTER — Telehealth: Payer: Self-pay | Admitting: *Deleted

## 2018-11-05 NOTE — Telephone Encounter (Signed)
-----   Message from Ward Givens, NP sent at 11/04/2018  8:42 AM EST ----- CT of the head is normal.  Please call patient with results

## 2018-11-05 NOTE — Telephone Encounter (Signed)
LMVM for pt that her CT head was a normal study per MM/NP on her Mobile/Home #. (ok per DPR).

## 2018-11-08 ENCOUNTER — Other Ambulatory Visit: Payer: Self-pay | Admitting: Orthopedic Surgery

## 2018-11-11 ENCOUNTER — Encounter (HOSPITAL_BASED_OUTPATIENT_CLINIC_OR_DEPARTMENT_OTHER): Payer: Self-pay | Admitting: *Deleted

## 2018-11-11 ENCOUNTER — Other Ambulatory Visit: Payer: Self-pay

## 2018-11-11 NOTE — Progress Notes (Signed)
Pt states she has Daith piercings in that cannot be removed.  States she had previous surgery at [facility] with piercings in.  States they are 316 L surgical stainless.
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
## 2018-11-26 ENCOUNTER — Ambulatory Visit (HOSPITAL_BASED_OUTPATIENT_CLINIC_OR_DEPARTMENT_OTHER): Payer: Worker's Compensation | Admitting: Anesthesiology

## 2018-11-26 ENCOUNTER — Encounter (HOSPITAL_BASED_OUTPATIENT_CLINIC_OR_DEPARTMENT_OTHER)
Admission: RE | Disposition: A | Discharge status: Home / Self Care | Payer: Self-pay | Source: Home / Self Care | Attending: Orthopedic Surgery

## 2018-11-26 ENCOUNTER — Ambulatory Visit (HOSPITAL_BASED_OUTPATIENT_CLINIC_OR_DEPARTMENT_OTHER)
Admission: RE | Admit: 2018-11-26 | Discharge: 2018-11-26 | Disposition: A | Discharge status: Home / Self Care | Payer: Worker's Compensation | Attending: Orthopedic Surgery | Admitting: Orthopedic Surgery

## 2018-11-26 ENCOUNTER — Encounter (HOSPITAL_BASED_OUTPATIENT_CLINIC_OR_DEPARTMENT_OTHER): Payer: Self-pay | Admitting: Certified Registered"

## 2018-11-26 ENCOUNTER — Other Ambulatory Visit: Payer: Self-pay

## 2018-11-26 DIAGNOSIS — Z6841 Body Mass Index (BMI) 40.0 and over, adult: Secondary | ICD-10-CM | POA: Diagnosis not present

## 2018-11-26 DIAGNOSIS — G43909 Migraine, unspecified, not intractable, without status migrainosus: Secondary | ICD-10-CM | POA: Diagnosis not present

## 2018-11-26 DIAGNOSIS — J45909 Unspecified asthma, uncomplicated: Secondary | ICD-10-CM | POA: Diagnosis not present

## 2018-11-26 DIAGNOSIS — Z79899 Other long term (current) drug therapy: Secondary | ICD-10-CM | POA: Insufficient documentation

## 2018-11-26 DIAGNOSIS — Z87891 Personal history of nicotine dependence: Secondary | ICD-10-CM | POA: Insufficient documentation

## 2018-11-26 DIAGNOSIS — Z791 Long term (current) use of non-steroidal anti-inflammatories (NSAID): Secondary | ICD-10-CM | POA: Insufficient documentation

## 2018-11-26 DIAGNOSIS — M25531 Pain in right wrist: Secondary | ICD-10-CM | POA: Diagnosis present

## 2018-11-26 DIAGNOSIS — F419 Anxiety disorder, unspecified: Secondary | ICD-10-CM | POA: Diagnosis not present

## 2018-11-26 DIAGNOSIS — M25831 Other specified joint disorders, right wrist: Secondary | ICD-10-CM | POA: Diagnosis not present

## 2018-11-26 HISTORY — PX: WRIST ARTHROSCOPY WITH DEBRIDEMENT: SHX6194

## 2018-11-26 SURGERY — WRIST ARTHROSCOPY WITH DEBRIDEMENT
Anesthesia: Monitor Anesthesia Care | Site: Wrist | Laterality: Right

## 2018-11-26 MED ORDER — FENTANYL CITRATE (PF) 100 MCG/2ML IJ SOLN: 25.0000 ug | INTRAMUSCULAR | Status: DC | PRN

## 2018-11-26 MED ORDER — FENTANYL CITRATE (PF) 100 MCG/2ML IJ SOLN
INTRAMUSCULAR | Status: AC
  Filled 2018-11-26: qty 2

## 2018-11-26 MED ORDER — ONDANSETRON HCL 4 MG/2ML IJ SOLN
INTRAMUSCULAR | Status: DC | PRN
  Administered 2018-11-26: 4 mg via INTRAVENOUS

## 2018-11-26 MED ORDER — CEFAZOLIN SODIUM-DEXTROSE 2-4 GM/100ML-% IV SOLN
2.0000 g | INTRAVENOUS | Status: AC
  Administered 2018-11-26: 2 g via INTRAVENOUS

## 2018-11-26 MED ORDER — FENTANYL CITRATE (PF) 100 MCG/2ML IJ SOLN
50.0000 ug | INTRAMUSCULAR | Status: DC | PRN
  Administered 2018-11-26: 100 ug via INTRAVENOUS

## 2018-11-26 MED ORDER — MIDAZOLAM HCL 2 MG/2ML IJ SOLN
1.0000 mg | INTRAMUSCULAR | Status: DC | PRN
  Administered 2018-11-26: 2 mg via INTRAVENOUS

## 2018-11-26 MED ORDER — CLONIDINE HCL (ANALGESIA) 100 MCG/ML EP SOLN
EPIDURAL | Status: DC | PRN
  Administered 2018-11-26: 100 ug

## 2018-11-26 MED ORDER — OXYCODONE-ACETAMINOPHEN 5-325 MG PO TABS: ORAL_TABLET | ORAL | 0 refills | Status: DC

## 2018-11-26 MED ORDER — DEXAMETHASONE SODIUM PHOSPHATE 10 MG/ML IJ SOLN
INTRAMUSCULAR | Status: DC | PRN
  Administered 2018-11-26: 10 mg via INTRAVENOUS

## 2018-11-26 MED ORDER — LIDOCAINE HCL (CARDIAC) PF 100 MG/5ML IV SOSY
PREFILLED_SYRINGE | INTRAVENOUS | Status: DC | PRN
  Administered 2018-11-26: 30 mg via INTRAVENOUS

## 2018-11-26 MED ORDER — CHLORHEXIDINE GLUCONATE 4 % EX LIQD: 60.0000 mL | Freq: Once | CUTANEOUS | Status: DC

## 2018-11-26 MED ORDER — ROPIVACAINE HCL 7.5 MG/ML IJ SOLN
INTRAMUSCULAR | Status: DC | PRN
  Administered 2018-11-26: 30 mL via PERINEURAL

## 2018-11-26 MED ORDER — SCOPOLAMINE 1 MG/3DAYS TD PT72: 1.0000 | MEDICATED_PATCH | Freq: Once | TRANSDERMAL | Status: DC | PRN

## 2018-11-26 MED ORDER — MIDAZOLAM HCL 2 MG/2ML IJ SOLN
INTRAMUSCULAR | Status: AC
  Filled 2018-11-26: qty 2

## 2018-11-26 MED ORDER — CEFAZOLIN SODIUM-DEXTROSE 2-4 GM/100ML-% IV SOLN
INTRAVENOUS | Status: AC
  Filled 2018-11-26: qty 100

## 2018-11-26 MED ORDER — LACTATED RINGERS IV SOLN
INTRAVENOUS | Status: DC
  Administered 2018-11-26: 12:00:00 via INTRAVENOUS

## 2018-11-26 MED ORDER — PROPOFOL 500 MG/50ML IV EMUL
INTRAVENOUS | Status: DC | PRN
  Administered 2018-11-26: 25 ug/kg/min via INTRAVENOUS

## 2018-11-26 MED ORDER — PROPOFOL 10 MG/ML IV BOLUS
INTRAVENOUS | Status: DC | PRN
  Administered 2018-11-26: 200 mg via INTRAVENOUS

## 2018-11-26 SURGICAL SUPPLY — 78 items
BANDAGE ACE 3X5.8 VEL STRL LF (GAUZE/BANDAGES/DRESSINGS) ×3 IMPLANT
BANDAGE ACE 4X5 VEL STRL LF (GAUZE/BANDAGES/DRESSINGS) ×3 IMPLANT
BLADE CUDA 2.0 (BLADE) IMPLANT
BLADE EAR TYMPAN 2.5 60D BEAV (BLADE) IMPLANT
BLADE MINI RND TIP GREEN BEAV (BLADE) IMPLANT
BLADE SURG 15 STRL LF DISP TIS (BLADE) ×4 IMPLANT
BLADE SURG 15 STRL SS (BLADE) ×1
BNDG ESMARK 4X9 LF (GAUZE/BANDAGES/DRESSINGS) IMPLANT
BNDG GAUZE ELAST 4 BULKY (GAUZE/BANDAGES/DRESSINGS) ×3 IMPLANT
BUR CUDA 2.9 (BURR) IMPLANT
BUR FULL RADIUS 2.0 (BURR) IMPLANT
BUR FULL RADIUS 2.9 (BURR) ×1 IMPLANT
BUR GATOR 2.9 (BURR) IMPLANT
BUR SPHERICAL 2.9 (BURR) IMPLANT
CANISTER SUCT 1200ML W/VALVE (MISCELLANEOUS) IMPLANT
CHLORAPREP W/TINT 26ML (MISCELLANEOUS) ×3 IMPLANT
CORD BIPOLAR FORCEPS 12FT (ELECTRODE) IMPLANT
COVER BACK TABLE 60X90IN (DRAPES) ×3 IMPLANT
COVER WAND RF STERILE (DRAPES) IMPLANT
CUFF TOURNIQUET SINGLE 18IN (TOURNIQUET CUFF) ×3 IMPLANT
DRAPE EXTREMITY T 121X128X90 (DISPOSABLE) ×3 IMPLANT
DRAPE IMP U-DRAPE 54X76 (DRAPES) ×6 IMPLANT
DRAPE OEC MINIVIEW 54X84 (DRAPES) IMPLANT
DRAPE SURG 17X23 STRL (DRAPES) ×3 IMPLANT
ELECT SMALL JOINT 90D BASC (ELECTRODE) IMPLANT
GAUZE SPONGE 4X4 12PLY STRL (GAUZE/BANDAGES/DRESSINGS) ×3 IMPLANT
GAUZE XEROFORM 1X8 LF (GAUZE/BANDAGES/DRESSINGS) ×3 IMPLANT
GLOVE BIO SURGEON STRL SZ 6.5 (GLOVE) IMPLANT
GLOVE BIO SURGEON STRL SZ7.5 (GLOVE) ×2 IMPLANT
GLOVE BIOGEL PI IND STRL 7.0 (GLOVE) IMPLANT
GLOVE BIOGEL PI IND STRL 8.5 (GLOVE) ×2 IMPLANT
GLOVE BIOGEL PI INDICATOR 7.0 (GLOVE) ×1
GLOVE BIOGEL PI INDICATOR 8.5 (GLOVE) ×1
GLOVE SURG ORTHO 8.0 STRL STRW (GLOVE) IMPLANT
GOWN STRL REUS W/ TWL LRG LVL3 (GOWN DISPOSABLE) ×2 IMPLANT
GOWN STRL REUS W/TWL LRG LVL3 (GOWN DISPOSABLE) ×1
GOWN STRL REUS W/TWL XL LVL3 (GOWN DISPOSABLE) ×4 IMPLANT
IV SET EXTENSION GRAVITY 40 LF (IV SETS) ×3 IMPLANT
MANIFOLD NEPTUNE II (INSTRUMENTS) IMPLANT
NDL EPIDURAL TUOHY 20GX3.5 (NEEDLE) IMPLANT
NDL SAFETY ECLIPSE 18X1.5 (NEEDLE) ×2 IMPLANT
NDL SPNL 18GX3.5 QUINCKE PK (NEEDLE) IMPLANT
NEEDLE HYPO 18GX1.5 SHARP (NEEDLE) ×1
NEEDLE HYPO 22GX1.5 SAFETY (NEEDLE) ×3 IMPLANT
NEEDLE SPNL 18GX3.5 QUINCKE PK (NEEDLE) IMPLANT
NEEDLE TUOHY 20GX3.5 (NEEDLE) IMPLANT
PACK BASIN DAY SURGERY FS (CUSTOM PROCEDURE TRAY) ×3 IMPLANT
PAD CAST 3X4 CTTN HI CHSV (CAST SUPPLIES) ×2 IMPLANT
PADDING CAST ABS 3INX4YD NS (CAST SUPPLIES) ×1
PADDING CAST ABS 4INX4YD NS (CAST SUPPLIES) ×1
PADDING CAST ABS COTTON 3X4 (CAST SUPPLIES) ×2 IMPLANT
PADDING CAST ABS COTTON 4X4 ST (CAST SUPPLIES) ×2 IMPLANT
PADDING CAST COTTON 3X4 STRL (CAST SUPPLIES) ×1
PROBE BIPOLAR ARTHRO 85MM 30D (MISCELLANEOUS) IMPLANT
ROUTER HOODED VORTEX 2.9MM (BLADE) IMPLANT
SET SM JOINT TUBING/CANN (CANNULA) IMPLANT
SLEEVE SCD COMPRESS KNEE MED (MISCELLANEOUS) ×3 IMPLANT
SPLINT PLASTER CAST XFAST 3X15 (CAST SUPPLIES) ×60 IMPLANT
SPLINT PLASTER XTRA FASTSET 3X (CAST SUPPLIES) ×10
STOCKINETTE 4X48 STRL (DRAPES) ×3 IMPLANT
STRIP CLOSURE SKIN 1/2X4 (GAUZE/BANDAGES/DRESSINGS) IMPLANT
STRIP CLOSURE SKIN 1/4X4 (GAUZE/BANDAGES/DRESSINGS) IMPLANT
SUCTION FRAZIER HANDLE 10FR (MISCELLANEOUS)
SUCTION TUBE FRAZIER 10FR DISP (MISCELLANEOUS) IMPLANT
SUT ETHILON 4 0 PS 2 18 (SUTURE) IMPLANT
SUT ETHILON 5 0 P 3 18 (SUTURE)
SUT MERSILENE 4 0 P 3 (SUTURE) IMPLANT
SUT NYLON ETHILON 5-0 P-3 1X18 (SUTURE) IMPLANT
SUT PDS AB 2-0 CT2 27 (SUTURE) IMPLANT
SUT STEEL 4 0 (SUTURE) IMPLANT
SUT VIC AB 2-0 PS2 27 (SUTURE) IMPLANT
SUT VICRYL 4-0 PS2 18IN ABS (SUTURE) IMPLANT
SYR BULB 3OZ (MISCELLANEOUS) ×3 IMPLANT
SYR CONTROL 10ML LL (SYRINGE) ×3 IMPLANT
TUBE CONNECTING 20X1/4 (TUBING) IMPLANT
TUBING ARTHRO INFLOW-ONLY STRL (TUBING) ×1 IMPLANT
UNDERPAD 30X30 (UNDERPADS AND DIAPERS) ×3 IMPLANT
WATER STERILE IRR 1000ML POUR (IV SOLUTION) ×3 IMPLANT

## 2018-11-26 NOTE — Discharge Instructions (Signed)
Post Anesthesia Home Care Instructions  Activity: Get plenty of rest for the remainder of the day. A responsible individual must stay with you for 24 hours following the procedure.  For the next 24 hours, DO NOT: -Drive a car -Operate machinery -Drink alcoholic beverages -Take any medication unless instructed by your physician -Make any legal decisions or sign important papers.  Meals: Start with liquid foods such as gelatin or soup. Progress to regular foods as tolerated. Avoid greasy, spicy, heavy foods. If nausea and/or vomiting occur, drink only clear liquids until the nausea and/or vomiting subsides. Call your physician if vomiting continues.  Special Instructions/Symptoms: Your throat may feel dry or sore from the anesthesia or the breathing tube placed in your throat during surgery. If this causes discomfort, gargle with warm salt water. The discomfort should disappear within 24 hours.  If you had a scopolamine patch placed behind your ear for the management of post- operative nausea and/or vomiting:  1. The medication in the patch is effective for 72 hours, after which it should be removed.  Wrap patch in a tissue and discard in the trash. Wash hands thoroughly with soap and water. 2. You may remove the patch earlier than 72 hours if you experience unpleasant side effects which may include dry mouth, dizziness or visual disturbances. 3. Avoid touching the patch. Wash your hands with soap and water after contact with the patch.      Regional Anesthesia Blocks  1. Numbness or the inability to move the "blocked" extremity may last from 3-48 hours after placement. The length of time depends on the medication injected and your individual response to the medication. If the numbness is not going away after 48 hours, call your surgeon.  2. The extremity that is blocked will need to be protected until the numbness is gone and the  Strength has returned. Because you cannot feel it, you  will need to take extra care to avoid injury. Because it may be weak, you may have difficulty moving it or using it. You may not know what position it is in without looking at it while the block is in effect.  3. For blocks in the legs and feet, returning to weight bearing and walking needs to be done carefully. You will need to wait until the numbness is entirely gone and the strength has returned. You should be able to move your leg and foot normally before you try and bear weight or walk. You will need someone to be with you when you first try to ensure you do not fall and possibly risk injury.  4. Bruising and tenderness at the needle site are common side effects and will resolve in a few days.  5. Persistent numbness or new problems with movement should be communicated to the surgeon or the [facility] ([phone])/ [facility] ([phone]).    [facility] Center Instructions Hand Surgery  Wound Care: Keep your hand elevated above the level of your heart.  Do not allow it to dangle by your side.  Keep the dressing dry and do not remove it unless your doctor advises you to do so.  He will usually change it at the time of your post-op visit.  Moving your fingers is advised to stimulate circulation but will depend on the site of your surgery.  If you have a splint applied, your doctor will advise you regarding movement.  Activity: Do not drive or operate machinery today.  Rest today and   then you may return to your normal activity and work as indicated by your physician.  Diet:  Drink liquids today or eat a light diet.  You may resume a regular diet tomorrow.    General expectations: Pain for two to three days. Fingers may become slightly swollen.  Call your doctor if any of the following occur: Severe pain not relieved by pain medication. Elevated temperature. Dressing soaked with blood. Inability to move fingers. White or bluish color to fingers.  
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
## 2018-11-26 NOTE — Anesthesia Postprocedure Evaluation (Signed)
Anesthesia Post Note  Patient: [name]  Procedure(s) Performed: WRIST ARTHROSCOPY WITH DEBRIDEMENT (Right Wrist)     Patient location during evaluation: PACU Anesthesia Type: Regional and MAC Level of consciousness: awake and alert Pain management: pain level controlled Vital Signs Assessment: post-procedure vital signs reviewed and stable Respiratory status: spontaneous breathing, nonlabored ventilation, respiratory function stable and patient connected to nasal cannula oxygen Cardiovascular status: stable and blood pressure returned to baseline Postop Assessment: no apparent nausea or vomiting Anesthetic complications: no    Last Vitals:  Vitals:   11/26/18 1426 11/26/18 1455  BP: 103/62 114/66  Pulse: 95 73  Resp: 20 18  Temp:  36.7 C  SpO2: 93% 96%    Last Pain:  Vitals:   11/26/18 1455  TempSrc:   PainSc: 0-No pain                 Kynlee Koenigsberg

## 2018-11-26 NOTE — Anesthesia Procedure Notes (Signed)
Procedure Name: LMA Insertion Date/Time: 11/26/2018 1:07 PM Performed by: Signe Colt, CRNA Pre-anesthesia Checklist: Patient identified, Emergency Drugs available, Suction available and Patient being monitored Patient Re-evaluated:Patient Re-evaluated prior to induction Oxygen Delivery Method: Circle system utilized Preoxygenation: Pre-oxygenation with 100% oxygen Induction Type: IV induction Ventilation: Mask ventilation without difficulty LMA: LMA inserted LMA Size: 4.0 Number of attempts: 1 Airway Equipment and Method: Bite block Placement Confirmation: positive ETCO2 Tube secured with: Tape Dental Injury: Teeth and Oropharynx as per pre-operative assessment

## 2018-11-26 NOTE — Progress Notes (Signed)
Assisted Dr. Oddono with right, ultrasound guided, supraclavicular block. Side rails up, monitors on throughout procedure. See vital signs in flow sheet. Tolerated Procedure well. 
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
## 2018-11-26 NOTE — Anesthesia Procedure Notes (Addendum)
Anesthesia Regional Block: Supraclavicular block   Pre-Anesthetic Checklist: ,, timeout performed, Correct Patient, Correct Site, Correct Laterality, Correct Procedure, Correct Position, site marked, Risks and benefits discussed,  Surgical consent,  Pre-op evaluation,  At surgeon's request and post-op pain management  Laterality: Right  Prep: chloraprep       Needles:  Injection technique: Single-shot  Needle Type: Echogenic Stimulator Needle     Needle Length: 5cm  Needle Gauge: 22     Additional Needles:   Procedures:, nerve stimulator,,, ultrasound used (permanent image in chart),,,,   Nerve Stimulator or Paresthesia:  Response: delt, 0.45 mA,   Additional Responses:   Narrative:  Start time: 11/26/2018 12:20 PM End time: 11/26/2018 12:25 PM Injection made incrementally with aspirations every 5 mL.  Performed by: Personally  Anesthesiologist: Janeece Riggers, MD  Additional Notes: Functioning IV was confirmed and monitors were applied.  A 35mm 22ga Arrow echogenic stimulator needle was used. Sterile prep and drape,hand hygiene and sterile gloves were used. Ultrasound guidance: relevant anatomy identified, needle position confirmed, local anesthetic spread visualized around nerve(s)., vascular puncture avoided.  Image printed for medical record. Negative aspiration and negative test dose prior to incremental administration of local anesthetic. The patient tolerated the procedure well.

## 2018-11-26 NOTE — Anesthesia Preprocedure Evaluation (Addendum)
Anesthesia Evaluation  Patient identified by MRN, date of birth, ID band Patient awake    Reviewed: Allergy & Precautions, NPO status , Patient's Chart, lab work & pertinent test results  Airway Mallampati: I  TM Distance: >3 FB Neck ROM: Full  Mouth opening: Limited Mouth Opening  Dental no notable dental hx. (+) Teeth Intact, Dental Advisory Given   Pulmonary asthma , former smoker,    Pulmonary exam normal breath sounds clear to auscultation       Cardiovascular negative cardio ROS Normal cardiovascular exam Rhythm:Regular Rate:Normal     Neuro/Psych  Headaches, PSYCHIATRIC DISORDERS Anxiety    GI/Hepatic negative GI ROS, Neg liver ROS,   Endo/Other  Morbid obesity  Renal/GU negative Renal ROS  negative genitourinary   Musculoskeletal negative musculoskeletal ROS (+)   Abdominal   Peds  Hematology negative hematology ROS (+)   Anesthesia Other Findings Right wrist arthroscopy  Reproductive/Obstetrics                            Anesthesia Physical Anesthesia Plan  ASA: III  Anesthesia Plan: MAC and Regional   Post-op Pain Management:  Regional for Post-op pain   Induction: Intravenous  PONV Risk Score and Plan: 2 and Ondansetron, Dexamethasone and Midazolam  Airway Management Planned: Simple Face Mask and Natural Airway  Additional Equipment:   Intra-op Plan:   Post-operative Plan:   Informed Consent: I have reviewed the patients History and Physical, chart, labs and discussed the procedure including the risks, benefits and alternatives for the proposed anesthesia with the patient or authorized representative who has indicated his/her understanding and acceptance.   Dental advisory given  Plan Discussed with: CRNA  Anesthesia Plan Comments:         Anesthesia Quick Evaluation

## 2018-11-26 NOTE — Op Note (Signed)
I assisted Surgeon(s) and Role:    * Leanora Cover, MD - Primary    Daryll Brod, MD - Assisting on the Procedure(s): WRIST ARTHROSCOPY WITH DEBRIDEMENT on 11/26/2018.  I provided assistance on this case as follows: setup,debridement, closure and appilicaton of the dressings and splint.  Electronically signed by: Daryll Brod, MD Date: 11/26/2018 Time: 2:08 PM

## 2018-11-26 NOTE — Transfer of Care (Signed)
Immediate Anesthesia Transfer of Care Note  Patient: [name]  Procedure(s) Performed: WRIST ARTHROSCOPY WITH DEBRIDEMENT (Right Wrist)  Patient Location: PACU  Anesthesia Type:GA combined with regional for post-op pain  Level of Consciousness: awake, alert , oriented and patient cooperative  Airway & Oxygen Therapy: Patient Spontanous Breathing and Patient connected to face mask oxygen  Post-op Assessment: Report given to RN and Post -op Vital signs reviewed and stable  Post vital signs: Reviewed and stable  Last Vitals:  Vitals Value Taken Time  BP 112/66 11/26/2018  1:55 PM  Temp    Pulse 91 11/26/2018  1:58 PM  Resp 17 11/26/2018  1:58 PM  SpO2 98 % 11/26/2018  1:58 PM  Vitals shown include unvalidated device data.  Last Pain:  Vitals:   11/26/18 1149  TempSrc: Oral  PainSc: 0-No pain         Complications: No apparent anesthesia complications

## 2018-11-27 ENCOUNTER — Encounter (HOSPITAL_BASED_OUTPATIENT_CLINIC_OR_DEPARTMENT_OTHER): Payer: Self-pay | Admitting: Orthopedic Surgery

## 2018-12-30 ENCOUNTER — Encounter: Payer: Self-pay | Admitting: Adult Health

## 2018-12-31 ENCOUNTER — Other Ambulatory Visit: Payer: Self-pay | Admitting: Neurology

## 2018-12-31 MED ORDER — ONDANSETRON 4 MG PO TBDP: 4.0000 mg | ORAL_TABLET | Freq: Three times a day (TID) | ORAL | 3 refills | Status: DC | PRN

## 2018-12-31 MED ORDER — PROMETHAZINE HCL 25 MG PO TABS: 25.0000 mg | ORAL_TABLET | Freq: Four times a day (QID) | ORAL | 3 refills | Status: DC | PRN

## 2018-12-31 NOTE — Telephone Encounter (Signed)
Pt requesting prescription for nausea associated with migraine.  No allergies to zofran or phenergan. MM/NP out.

## 2019-02-13 ENCOUNTER — Encounter (HOSPITAL_BASED_OUTPATIENT_CLINIC_OR_DEPARTMENT_OTHER): Admission: RE | Payer: Self-pay | Source: Home / Self Care

## 2019-02-13 ENCOUNTER — Ambulatory Visit (HOSPITAL_BASED_OUTPATIENT_CLINIC_OR_DEPARTMENT_OTHER)
Admission: RE | Admit: 2019-02-13 | Payer: Worker's Compensation | Source: Home / Self Care | Admitting: Orthopedic Surgery

## 2019-02-13 SURGERY — SHORTENING, ULNA
Anesthesia: Choice | Laterality: Right

## 2019-03-27 ENCOUNTER — Other Ambulatory Visit: Payer: Self-pay | Admitting: Orthopedic Surgery

## 2019-03-28 ENCOUNTER — Other Ambulatory Visit: Payer: Self-pay | Admitting: Orthopedic Surgery

## 2019-04-03 ENCOUNTER — Encounter (HOSPITAL_BASED_OUTPATIENT_CLINIC_OR_DEPARTMENT_OTHER): Payer: Self-pay | Admitting: *Deleted

## 2019-04-03 ENCOUNTER — Other Ambulatory Visit: Payer: Self-pay

## 2019-04-09 ENCOUNTER — Other Ambulatory Visit: Payer: Self-pay | Admitting: Adult Health

## 2019-04-14 ENCOUNTER — Encounter: Payer: Self-pay | Admitting: Adult Health

## 2019-04-21 ENCOUNTER — Other Ambulatory Visit (HOSPITAL_COMMUNITY)
Admission: RE | Admit: 2019-04-21 | Discharge: 2019-04-21 | Disposition: A | Discharge status: Home / Self Care | Payer: BC Managed Care – PPO | Source: Ambulatory Visit | Attending: Orthopedic Surgery | Admitting: Orthopedic Surgery

## 2019-04-21 DIAGNOSIS — Z1159 Encounter for screening for other viral diseases: Secondary | ICD-10-CM | POA: Diagnosis present

## 2019-04-21 NOTE — Pre-Procedure Instructions (Signed)
Pt in to pick up ensure pre op drink. Instructions reviewed.

## 2019-04-23 ENCOUNTER — Telehealth: Payer: Self-pay

## 2019-04-23 LAB — NOVEL CORONAVIRUS, NAA (HOSP ORDER, SEND-OUT TO REF LAB; TAT 18-24 HRS): SARS-CoV-2, NAA: NOT DETECTED

## 2019-04-23 NOTE — Telephone Encounter (Signed)
Spoke with the patient and they have given verbal consent to file their insurance and to do a doxy.me visit. Mobile number and carrier have been confirmed and sent.   Text: [phone] Youth worker)

## 2019-04-23 NOTE — Anesthesia Preprocedure Evaluation (Addendum)
Anesthesia Evaluation  Patient identified by MRN, date of birth, ID band Patient awake    Reviewed: Allergy & Precautions, NPO status , Patient's Chart, lab work & pertinent test results  Airway Mallampati: II  TM Distance: >3 FB Neck ROM: Full    Dental no notable dental hx. (+) Teeth Intact   Pulmonary asthma , former smoker,    Pulmonary exam normal breath sounds clear to auscultation       Cardiovascular Exercise Tolerance: Good negative cardio ROS Normal cardiovascular exam Rhythm:Regular Rate:Normal     Neuro/Psych  Headaches, Anxiety    GI/Hepatic Neg liver ROS,   Endo/Other  negative endocrine ROS  Renal/GU negative Renal ROS     Musculoskeletal negative musculoskeletal ROS (+)   Abdominal (+) + obese,   Peds  Hematology   Anesthesia Other Findings   Reproductive/Obstetrics negative OB ROS                            Anesthesia Physical Anesthesia Plan  ASA: III  Anesthesia Plan: Regional and MAC   Post-op Pain Management:    Induction: Intravenous  PONV Risk Score and Plan: Treatment may vary due to age or medical condition  Airway Management Planned: Natural Airway  Additional Equipment:   Intra-op Plan:   Post-operative Plan:   Informed Consent:     Dental advisory given  Plan Discussed with: CRNA  Anesthesia Plan Comments: (Supraclavicular w Mac)      Anesthesia Quick Evaluation

## 2019-04-24 ENCOUNTER — Encounter (HOSPITAL_BASED_OUTPATIENT_CLINIC_OR_DEPARTMENT_OTHER): Payer: Self-pay

## 2019-04-24 ENCOUNTER — Ambulatory Visit (HOSPITAL_BASED_OUTPATIENT_CLINIC_OR_DEPARTMENT_OTHER): Payer: Worker's Compensation | Admitting: Anesthesiology

## 2019-04-24 ENCOUNTER — Other Ambulatory Visit: Payer: Self-pay

## 2019-04-24 ENCOUNTER — Ambulatory Visit (HOSPITAL_BASED_OUTPATIENT_CLINIC_OR_DEPARTMENT_OTHER)
Admission: RE | Admit: 2019-04-24 | Discharge: 2019-04-24 | Disposition: A | Discharge status: Home / Self Care | Payer: Worker's Compensation | Attending: Orthopedic Surgery | Admitting: Orthopedic Surgery

## 2019-04-24 ENCOUNTER — Encounter (HOSPITAL_BASED_OUTPATIENT_CLINIC_OR_DEPARTMENT_OTHER)
Admission: RE | Disposition: A | Discharge status: Home / Self Care | Payer: Self-pay | Source: Home / Self Care | Attending: Orthopedic Surgery

## 2019-04-24 DIAGNOSIS — Z791 Long term (current) use of non-steroidal anti-inflammatories (NSAID): Secondary | ICD-10-CM | POA: Diagnosis not present

## 2019-04-24 DIAGNOSIS — S52501P Unspecified fracture of the lower end of right radius, subsequent encounter for closed fracture with malunion: Secondary | ICD-10-CM | POA: Insufficient documentation

## 2019-04-24 DIAGNOSIS — G43909 Migraine, unspecified, not intractable, without status migrainosus: Secondary | ICD-10-CM | POA: Insufficient documentation

## 2019-04-24 DIAGNOSIS — Z6841 Body Mass Index (BMI) 40.0 and over, adult: Secondary | ICD-10-CM | POA: Diagnosis not present

## 2019-04-24 DIAGNOSIS — Z87891 Personal history of nicotine dependence: Secondary | ICD-10-CM | POA: Insufficient documentation

## 2019-04-24 DIAGNOSIS — E669 Obesity, unspecified: Secondary | ICD-10-CM | POA: Diagnosis not present

## 2019-04-24 DIAGNOSIS — Z79899 Other long term (current) drug therapy: Secondary | ICD-10-CM | POA: Diagnosis not present

## 2019-04-24 DIAGNOSIS — J45909 Unspecified asthma, uncomplicated: Secondary | ICD-10-CM | POA: Diagnosis not present

## 2019-04-24 DIAGNOSIS — F419 Anxiety disorder, unspecified: Secondary | ICD-10-CM | POA: Diagnosis not present

## 2019-04-24 HISTORY — PX: ULNA OSTEOTOMY: SHX1077

## 2019-04-24 SURGERY — SHORTENING, ULNA
Anesthesia: Monitor Anesthesia Care | Site: Arm Lower | Laterality: Right

## 2019-04-24 MED ORDER — FENTANYL CITRATE (PF) 100 MCG/2ML IJ SOLN
INTRAMUSCULAR | Status: AC
  Filled 2019-04-24: qty 2

## 2019-04-24 MED ORDER — HYDROCODONE-ACETAMINOPHEN 7.5-325 MG PO TABS: 1.0000 | ORAL_TABLET | Freq: Once | ORAL | Status: DC | PRN

## 2019-04-24 MED ORDER — MIDAZOLAM HCL 2 MG/2ML IJ SOLN
1.0000 mg | INTRAMUSCULAR | Status: DC | PRN
  Administered 2019-04-24 (×2): 2 mg via INTRAVENOUS

## 2019-04-24 MED ORDER — SCOPOLAMINE 1 MG/3DAYS TD PT72: 1.0000 | MEDICATED_PATCH | Freq: Once | TRANSDERMAL | Status: DC | PRN

## 2019-04-24 MED ORDER — MIDAZOLAM HCL 2 MG/2ML IJ SOLN
INTRAMUSCULAR | Status: AC
  Filled 2019-04-24: qty 2

## 2019-04-24 MED ORDER — FENTANYL CITRATE (PF) 100 MCG/2ML IJ SOLN
50.0000 ug | INTRAMUSCULAR | Status: AC | PRN
  Administered 2019-04-24 (×3): 50 ug via INTRAVENOUS

## 2019-04-24 MED ORDER — CEFAZOLIN SODIUM-DEXTROSE 2-4 GM/100ML-% IV SOLN
2.0000 g | INTRAVENOUS | Status: AC
  Administered 2019-04-24: 2 g via INTRAVENOUS

## 2019-04-24 MED ORDER — LACTATED RINGERS IV SOLN
INTRAVENOUS | Status: DC
  Administered 2019-04-24: 09:00:00 via INTRAVENOUS

## 2019-04-24 MED ORDER — ONDANSETRON HCL 4 MG/2ML IJ SOLN: 4.0000 mg | Freq: Once | INTRAMUSCULAR | Status: DC | PRN

## 2019-04-24 MED ORDER — MEPERIDINE HCL 25 MG/ML IJ SOLN: 6.2500 mg | INTRAMUSCULAR | Status: DC | PRN

## 2019-04-24 MED ORDER — OXYCODONE-ACETAMINOPHEN 5-325 MG PO TABS: ORAL_TABLET | ORAL | 0 refills | Status: DC

## 2019-04-24 MED ORDER — CHLORHEXIDINE GLUCONATE 4 % EX LIQD: 60.0000 mL | Freq: Once | CUTANEOUS | Status: DC

## 2019-04-24 MED ORDER — PROPOFOL 10 MG/ML IV BOLUS
INTRAVENOUS | Status: DC | PRN
  Administered 2019-04-24: 20 mg via INTRAVENOUS

## 2019-04-24 MED ORDER — ONDANSETRON HCL 4 MG/2ML IJ SOLN
INTRAMUSCULAR | Status: DC | PRN
  Administered 2019-04-24: 4 mg via INTRAVENOUS

## 2019-04-24 MED ORDER — HYDROMORPHONE HCL 1 MG/ML IJ SOLN: 0.2500 mg | INTRAMUSCULAR | Status: DC | PRN

## 2019-04-24 MED ORDER — ROPIVACAINE HCL 5 MG/ML IJ SOLN
INTRAMUSCULAR | Status: DC | PRN
  Administered 2019-04-24: 30 mL via PERINEURAL

## 2019-04-24 MED ORDER — PROPOFOL 500 MG/50ML IV EMUL
INTRAVENOUS | Status: DC | PRN
  Administered 2019-04-24: 100 ug/kg/min via INTRAVENOUS

## 2019-04-24 MED ORDER — ACETAMINOPHEN 10 MG/ML IV SOLN: 1000.0000 mg | Freq: Once | INTRAVENOUS | Status: DC | PRN

## 2019-04-24 MED ORDER — CEFAZOLIN SODIUM-DEXTROSE 2-4 GM/100ML-% IV SOLN
INTRAVENOUS | Status: AC
  Filled 2019-04-24: qty 100

## 2019-04-24 SURGICAL SUPPLY — 70 items
BAG DECANTER FOR FLEXI CONT (MISCELLANEOUS) ×2 IMPLANT
BANDAGE ACE 4X5 VEL STRL LF (GAUZE/BANDAGES/DRESSINGS) ×2 IMPLANT
BIT DRILL 2.8X5 QR DISP (BIT) ×2 IMPLANT
BIT DRILL QUICK RELEASE 3.5MM (BIT) ×1 IMPLANT
BLADE ARTHRO LOK 4 BEAVER (BLADE) IMPLANT
BLADE EAR TYMPAN 2.5 60D BEAV (BLADE) IMPLANT
BLADE MINI RND TIP GREEN BEAV (BLADE) IMPLANT
BLADE SAW OSTEOTOMY (BLADE) ×2 IMPLANT
BLADE SURG 15 STRL LF DISP TIS (BLADE) ×2 IMPLANT
BLADE SURG 15 STRL SS (BLADE) ×2
BNDG ESMARK 4X9 LF (GAUZE/BANDAGES/DRESSINGS) ×2 IMPLANT
BNDG GAUZE ELAST 4 BULKY (GAUZE/BANDAGES/DRESSINGS) ×2 IMPLANT
BONE TRINITY ELITE 1.2CC SM (Bone Implant) ×2 IMPLANT
CHLORAPREP W/TINT 26 (MISCELLANEOUS) ×2 IMPLANT
CORD BIPOLAR FORCEPS 12FT (ELECTRODE) ×2 IMPLANT
COVER BACK TABLE REUSABLE LG (DRAPES) ×2 IMPLANT
COVER MAYO STAND REUSABLE (DRAPES) ×2 IMPLANT
COVER WAND RF STERILE (DRAPES) IMPLANT
CUFF TOURN SGL QUICK 18X4 (TOURNIQUET CUFF) ×2 IMPLANT
DECANTER SPIKE VIAL GLASS SM (MISCELLANEOUS) IMPLANT
DRAPE EXTREMITY T 121X128X90 (DISPOSABLE) ×2 IMPLANT
DRAPE OEC MINIVIEW 54X84 (DRAPES) ×2 IMPLANT
DRAPE SURG 17X23 STRL (DRAPES) ×2 IMPLANT
DRILL QUICK RELEASE 3.5MM (BIT) ×2
GAUZE SPONGE 4X4 12PLY STRL (GAUZE/BANDAGES/DRESSINGS) ×2 IMPLANT
GAUZE XEROFORM 1X8 LF (GAUZE/BANDAGES/DRESSINGS) ×2 IMPLANT
GLOVE BIO SURGEON STRL SZ7.5 (GLOVE) IMPLANT
GLOVE BIOGEL PI IND STRL 7.0 (GLOVE) ×2 IMPLANT
GLOVE BIOGEL PI IND STRL 8 (GLOVE) ×1 IMPLANT
GLOVE BIOGEL PI IND STRL 8.5 (GLOVE) ×1 IMPLANT
GLOVE BIOGEL PI INDICATOR 7.0 (GLOVE) ×2
GLOVE BIOGEL PI INDICATOR 8 (GLOVE) ×1
GLOVE BIOGEL PI INDICATOR 8.5 (GLOVE) ×1
GLOVE SURG ORTHO 8.0 STRL STRW (GLOVE) IMPLANT
GLOVE SURG SS PI 6.5 STRL IVOR (GLOVE) ×4 IMPLANT
GLOVE SURG SS PI 7.5 STRL IVOR (GLOVE) ×2 IMPLANT
GLOVE SURG SS PI 8.0 STRL IVOR (GLOVE) ×2 IMPLANT
GOWN STRL REUS W/ TWL LRG LVL3 (GOWN DISPOSABLE) ×1 IMPLANT
GOWN STRL REUS W/TWL LRG LVL3 (GOWN DISPOSABLE) ×1
GOWN STRL REUS W/TWL XL LVL3 (GOWN DISPOSABLE) ×4 IMPLANT
GUIDEWIRE ORTHO 0.054X6 (WIRE) ×4 IMPLANT
NS IRRIG 1000ML POUR BTL (IV SOLUTION) ×2 IMPLANT
PACK BASIN DAY SURGERY FS (CUSTOM PROCEDURE TRAY) ×2 IMPLANT
PAD CAST 4YDX4 CTTN HI CHSV (CAST SUPPLIES) ×1 IMPLANT
PADDING CAST ABS 3INX4YD NS (CAST SUPPLIES)
PADDING CAST ABS 4INX4YD NS (CAST SUPPLIES) ×1
PADDING CAST ABS COTTON 3X4 (CAST SUPPLIES) IMPLANT
PADDING CAST ABS COTTON 4X4 ST (CAST SUPPLIES) ×1 IMPLANT
PADDING CAST COTTON 4X4 STRL (CAST SUPPLIES) ×1
PLATE ULNAR SHORTENING (Plate) ×2 IMPLANT
SCREW BONE HEX N/L 3.5X9 (Screw) ×3 IMPLANT
SCREW BONE HEX N/L 3.5X9MM (Screw) ×6 IMPLANT
SCREW CANCEL 4.0X12 (Screw) ×2 IMPLANT
SCREW NON LOCK 3.5X10MM (Screw) ×4 IMPLANT
SCREW NON LOCK 3.5X8MM (Screw) ×4 IMPLANT
SLEEVE SCD COMPRESS KNEE MED (MISCELLANEOUS) ×2 IMPLANT
SLING ARM FOAM STRAP MED (SOFTGOODS) ×2 IMPLANT
SPLINT PLASTER CAST XFAST 3X15 (CAST SUPPLIES) ×30 IMPLANT
SPLINT PLASTER XTRA FASTSET 3X (CAST SUPPLIES) ×30
STOCKINETTE 4X48 STRL (DRAPES) ×2 IMPLANT
SUT ETHILON 4 0 PS 2 18 (SUTURE) ×2 IMPLANT
SUT VIC AB 2-0 SH 27 (SUTURE) ×1
SUT VIC AB 2-0 SH 27XBRD (SUTURE) ×1 IMPLANT
SUT VICRYL 4-0 PS2 18IN ABS (SUTURE) ×4 IMPLANT
SYR 20CC LL (SYRINGE) IMPLANT
SYR BULB 3OZ (MISCELLANEOUS) ×2 IMPLANT
SYR CONTROL 10ML LL (SYRINGE) IMPLANT
TOWEL GREEN STERILE FF (TOWEL DISPOSABLE) ×2 IMPLANT
UNDERPAD 30X30 (UNDERPADS AND DIAPERS) ×2 IMPLANT
WIRE TACK PLATE PL-PTACK (WIRE) ×2 IMPLANT

## 2019-04-24 NOTE — Op Note (Signed)
I assisted Surgeon(s) and Role:    * Leanora Cover, MD - Primary    Daryll Brod, MD - Assisting on the Procedure(s): RIGHT ULNAR SHORTENING OSTEOTOMY on 04/24/2019.  I provided assistance on this case as follows: setup, approach, placement of the plate, osteotomy, fixation of the plate , placement of the graft, closure of the incisions and application of the dressiings and splint.  Electronically signed by: Daryll Brod, MD Date: 04/24/2019 Time: 10:30 AM

## 2019-04-24 NOTE — Discharge Instructions (Addendum)
° °  ° ° ° °[facility] Center Instructions °Hand Surgery ° °Wound Care: °Keep your hand elevated above the level of your heart.  Do not allow it to dangle by your side.  Keep the dressing dry and do not remove it unless your doctor advises you to do so.  He will usually change it at the time of your post-op visit.  Moving your fingers is advised to stimulate circulation but will depend on the site of your surgery.  If you have a splint applied, your doctor will advise you regarding movement. ° °Activity: °Do not drive or operate machinery today.  Rest today and then you may return to your normal activity and work as indicated by your physician. ° °Diet:  °Drink liquids today or eat a light diet.  You may resume a regular diet tomorrow.   ° °General expectations: °Pain for two to three days. °Fingers may become slightly swollen. ° °Call your doctor if any of the following occur: °Severe pain not relieved by pain medication. °Elevated temperature. °Dressing soaked with blood. °Inability to move fingers. °White or bluish color to fingers. ° ° °Regional Anesthesia Blocks ° °1. Numbness or the inability to move the "blocked" extremity may last from 3-48 hours after placement. The length of time depends on the medication injected and your individual response to the medication. If the numbness is not going away after 48 hours, call your surgeon. ° °2. The extremity that is blocked will need to be protected until the numbness is gone and the  Strength has returned. Because you cannot feel it, you will need to take extra care to avoid injury. Because it may be weak, you may have difficulty moving it or using it. You may not know what position it is in without looking at it while the block is in effect. ° °3. For blocks in the legs and feet, returning to weight bearing and walking needs to be done carefully. You will need to wait until the numbness is entirely gone and the strength has returned. You should be able to move your leg  and foot normally before you try and bear weight or walk. You will need someone to be with you when you first try to ensure you do not fall and possibly risk injury. ° °4. Bruising and tenderness at the needle site are common side effects and will resolve in a few days. ° °5. Persistent numbness or new problems with movement should be communicated to the surgeon or the [facility] ([phone])/ [facility] ([phone]). ° ° ° °Post Anesthesia Home Care Instructions ° °Activity: °Get plenty of rest for the remainder of the day. A responsible individual must stay with you for 24 hours following the procedure.  °For the next 24 hours, DO NOT: °-Drive a car °-Operate machinery °-Drink alcoholic beverages °-Take any medication unless instructed by your physician °-Make any legal decisions or sign important papers. ° °Meals: °Start with liquid foods such as gelatin or soup. Progress to regular foods as tolerated. Avoid greasy, spicy, heavy foods. If nausea and/or vomiting occur, drink only clear liquids until the nausea and/or vomiting subsides. Call your physician if vomiting continues. ° °Special Instructions/Symptoms: °Your throat may feel dry or sore from the anesthesia or the breathing tube placed in your throat during surgery. If this causes discomfort, gargle with warm salt water. The discomfort should disappear within 24 hours. ° °If you had a scopolamine patch placed behind your ear for   the management of post- operative nausea and/or vomiting: ° °1. The medication in the patch is effective for 72 hours, after which it should be removed.  Wrap patch in a tissue and discard in the trash. Wash hands thoroughly with soap and water. °2. You may remove the patch earlier than 72 hours if you experience unpleasant side effects which may include dry mouth, dizziness or visual disturbances. °3. Avoid touching the patch. Wash your hands with soap and water after contact with the patch. °  ° ° °

## 2019-04-24 NOTE — Anesthesia Procedure Notes (Signed)
Anesthesia Regional Block: Supraclavicular block   Pre-Anesthetic Checklist: ,, timeout performed, Correct Patient, Correct Site, Correct Laterality, Correct Procedure, Correct Position, site marked, Risks and benefits discussed,  Surgical consent,  Pre-op evaluation,  At surgeon's request and post-op pain management  Laterality: Right  Prep: chloraprep       Needles:  Injection technique: Single-shot  Needle Type: Echogenic Needle     Needle Length: 5cm  Needle Gauge: 21     Additional Needles:   Procedures:,,,, ultrasound used (permanent image in chart),,,,  Narrative:  Start time: 04/24/2019 8:02 AM End time: 04/24/2019 8:08 AM Injection made incrementally with aspirations every 5 mL.  Performed by: Personally  Anesthesiologist: Barnet Glasgow, MD

## 2019-04-24 NOTE — Op Note (Signed)
Intra-operative fluoroscopic images in the AP, lateral, and oblique views were taken and evaluated by myself.  Reduction and hardware placement were confirmed.  There was no intraarticular penetration of permanent hardware.  

## 2019-04-24 NOTE — Transfer of Care (Signed)
Immediate Anesthesia Transfer of Care Note  Patient: [name]  Procedure(s) Performed: RIGHT ULNAR SHORTENING OSTEOTOMY (Right Arm Lower)  Patient Location: PACU  Anesthesia Type:MAC and MAC combined with regional for post-op pain  Level of Consciousness: awake, alert , oriented and drowsy  Airway & Oxygen Therapy: Patient Spontanous Breathing and Patient connected to face mask oxygen  Post-op Assessment: Report given to RN and Post -op Vital signs reviewed and stable  Post vital signs: Reviewed and stable  Last Vitals:  Vitals Value Taken Time  BP    Temp    Pulse 70 04/24/2019 10:35 AM  Resp 13 04/24/2019 10:35 AM  SpO2 100 % 04/24/2019 10:35 AM  Vitals shown include unvalidated device data.  Last Pain:  Vitals:   04/24/19 0735  TempSrc: Oral  PainSc: 0-No pain         Complications: No apparent anesthesia complications

## 2019-04-24 NOTE — Anesthesia Postprocedure Evaluation (Signed)
Anesthesia Post Note  Patient: [name]  Procedure(s) Performed: RIGHT ULNAR SHORTENING OSTEOTOMY (Right Arm Lower)     Patient location during evaluation: PACU Anesthesia Type: Regional Level of consciousness: awake and alert Pain management: pain level controlled Vital Signs Assessment: post-procedure vital signs reviewed and stable Respiratory status: spontaneous breathing, nonlabored ventilation, respiratory function stable and patient connected to nasal cannula oxygen Cardiovascular status: stable and blood pressure returned to baseline Postop Assessment: no apparent nausea or vomiting Anesthetic complications: no    Last Vitals:  Vitals:   04/24/19 1045 04/24/19 1055  BP: 107/74 112/74  Pulse: 66 71  Resp: 12 17  Temp:    SpO2: 100% 96%    Last Pain:  Vitals:   04/24/19 1055  TempSrc:   PainSc: 0-No pain                 Barnet Glasgow

## 2019-04-24 NOTE — Op Note (Signed)
NAME: [name] MEDICAL RECORD NO: [id number] DATE OF BIRTH: 1981/07/29 FACILITY: [facility] LOCATION: [facility] PHYSICIAN: Tennis Must, MD   OPERATIVE REPORT   DATE OF PROCEDURE: 04/24/19    PREOPERATIVE DIAGNOSIS:   Right ulnocarpal impaction   POSTOPERATIVE DIAGNOSIS:   Right ulnocarpal impaction   PROCEDURE:   Right ulnar shortening osteotomy   SURGEON:  Leanora Cover, M.D.   ASSISTANT: Daryll Brod, MD   ANESTHESIA:  Regional with sedation   INTRAVENOUS FLUIDS:  Per anesthesia flow sheet.   ESTIMATED BLOOD LOSS:  Minimal.   COMPLICATIONS:  None.   SPECIMENS:  none   TOURNIQUET TIME:    Total Tourniquet Time Documented: Upper Arm (Right) - 83 minutes Total: Upper Arm (Right) - 83 minutes    DISPOSITION:  Stable to PACU.   INDICATIONS: 38 year old male status post open reduction internal fixation of right distal radius fracture with continued ulnar-sided wrist pain and ulnocarpal impaction.  She wishes to proceed with ulnar shortening osteotomy. Risks, benefits and alternatives of surgery were discussed including the risks of blood loss, infection, damage to nerves, vessels, tendons, ligaments, bone for surgery, need for additional surgery, complications with wound healing, continued pain, nonunion, malunion, stiffness.  She voiced understanding of these risks and elected to proceed.  OPERATIVE COURSE:  After being identified preoperatively by myself,  the patient and I agreed on the procedure and site of the procedure.  The surgical site was marked.  Surgical consent had been signed. She was given IV antibiotics as preoperative antibiotic prophylaxis. She was transferred to the operating room and placed on the operating table in supine position with the Right upper extremity on an arm board.  Sedation was induced by the anesthesiologist. A regional block had been performed by anesthesia in preoperative holding.   Right upper extremity was  prepped and draped in normal sterile orthopedic fashion.  A surgical pause was performed between the surgeons, anesthesia, and operating room staff and all were in agreement as to the patient, procedure, and site of procedure.  Tourniquet at the proximal aspect of the extremity was inflated to 250 mmHg after exsanguination of the arm with an Esmarch bandage.    Incision was made at the ulnar side of the forearm.  This carried in subcutaneous tissues by spreading technique.  Bipolar electrocautery was used to obtain hemostasis.  A larger vein was tied with 4-0 Vicryl suture.  The ulna was accessed at the subcutaneous border.  The dorsal sensory branch of the ulnar nerve was identified distally and protected throughout the case.  The periosteum was elevated.  The Acumed ulnar shortening osteotomy plate was placed on the bone and secured with a clamp and then the guidepins.  C-arm was used in AP and lateral projections to ensure appropriate position of the plate which was the case.  Standard technique was followed.  The distal holes were drilled in the distalmost screw placed.  Good purchase was obtained.  The reduction pin was then placed in the proximal aspect of the slotted hole.  The cutting guide was placed and cuts made to remove 2 mm of bone.  The cutting guide was removed.  The proximal guidepin was removed.  The osteotomy site was reduced with the reduction clamps.  A screw was placed in the compression hole.  Good compression was obtained.  The lag screw was then placed.  The plate was toward the radial side of the bone and this was not a bicortical hole.  A 4.0 cancellus screw was placed.  The remaining holes were filled with nonlocking screws.  The guidepins were removed.  C-arm was used in AP and lateral projections to ensure appropriate reduction position of the hardware which was the case.  Radiographs of the wrist showed appropriate shortening with the ulna just shorter than the radius.  Trinity bone  graft was then placed around the osteotomy site with the exception of at the radial side at the interosseous membrane.  The wound had been copiously irrigated with sterile saline.  The muscle was then repaired back over top of the plate to dorsal subcutaneous tissues using 2-0 and 4-0 Vicryl suture.  4-0 Vicryl suture was used in inverted interrupted fashion in subcutaneous tissues and the skin was closed with 4-0 nylon in a horizontal mattress fashion.  The wound was dressed with sterile Xeroform 4 x 4's and wrapped with a Kerlix bandage.  A sugar tong splint was placed and wrapped with Kerlix and Ace bandage.  The tourniquet was deflated at 83 minutes.  Fingertips were pink with brisk capillary refill after deflation of tourniquet.  The operative  drapes were broken down.  The patient was awoken from anesthesia safely.  She was transferred back to the stretcher and taken to PACU in stable condition.  I will see her back in the office in 1 week for postoperative followup.  I will give her a prescription for Percocet 5/325 1-2 tabs PO q6 hours prn pain, dispense # 30.   Leanora Cover, MD Electronically signed, 04/24/19

## 2019-04-24 NOTE — Progress Notes (Signed)
Assisted Dr. Houser with right, ultrasound guided, supraclavicular block. Side rails up, monitors on throughout procedure. See vital signs in flow sheet. Tolerated Procedure well. 
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
## 2019-04-25 ENCOUNTER — Encounter (HOSPITAL_BASED_OUTPATIENT_CLINIC_OR_DEPARTMENT_OTHER): Payer: Self-pay | Admitting: Orthopedic Surgery

## 2019-04-30 ENCOUNTER — Other Ambulatory Visit: Payer: Self-pay

## 2019-04-30 ENCOUNTER — Telehealth: Payer: Self-pay

## 2019-04-30 ENCOUNTER — Ambulatory Visit (INDEPENDENT_AMBULATORY_CARE_PROVIDER_SITE_OTHER): Payer: BC Managed Care – PPO | Admitting: Family Medicine

## 2019-04-30 ENCOUNTER — Encounter: Payer: Self-pay | Admitting: Family Medicine

## 2019-04-30 DIAGNOSIS — G43009 Migraine without aura, not intractable, without status migrainosus: Secondary | ICD-10-CM

## 2019-04-30 MED ORDER — ERENUMAB-AOOE 140 MG/ML ~~LOC~~ SOAJ: 140.0000 mg | SUBCUTANEOUS | 3 refills | Status: DC

## 2019-04-30 NOTE — Progress Notes (Signed)
PATIENT: [name] DOB: 10/27/1981  REASON FOR VISIT: follow up HISTORY FROM: patient  Virtual Visit via Telephone Note  I connected with [name] on 04/30/19 at  2:30 PM EDT by telephone and verified that I am speaking with the correct person using two identifiers.   I discussed the limitations, risks, security and privacy concerns of performing an evaluation and management service by telephone and the availability of in person appointments. I also discussed with the patient that there may be a patient responsible charge related to this service. The patient expressed understanding and agreed to proceed.   History of Present Illness:  04/30/19 [name] is a 38 y.o. male here today for follow up of migraines.  She reports that she is doing fairly well.  Migraines have decreased significantly on gabapentin.  She is concerned, as well as her wife, about medication side effects with gabapentin.  She reports since starting this medication she has become much more irritable.  She is also experiencing brain fog and memory loss.  She feels that it related to gabapentin.  She has taken Wellbutrin and Prozac in the past with worsening migraines.  She is currently on Celexa 10 mg and doing well.  She uses Celebrex or Toradol injections (not at the same time) for abortive therapy but has not needed this recently.     Observations/Objective:  Generalized: Well developed, in no acute distress  Mentation: Alert oriented to time, place, history taking. Follows all commands speech and language fluent   Assessment and Plan:  38 y.o. year old male  has a past medical history of Abrasions of multiple sites (06/11/2018), Anxiety, Asthma, Dental crowns present, Distal radius fracture, right (06/11/2018), Migraines, and Vaginal bleeding. here with    ICD-10-CM   1. Migraine without aura and without status migrainosus, not intractable G43.009     [name] is doing very  well on gabapentin, however, she has some concerns that it may be causing short-term memory loss and irritability.  She would like to consider alternative treatment.  We have discussed several options and I feel that Aimovig 140 mg IM injection monthly would be a good fit for her.  I have called this medication into her pharmacy.  I have discussed side effects and appropriate administration as well as storage of medication.  She verbalizes understanding.  I have scheduled her for a 23-month follow-up with me to reassess migraines after starting Aimovig.  I have also advised that she titrate gabapentin to every other day dosing for the next 2 weeks.  She may discontinue gabapentin at that time.  She verbalizes understanding and agreement with this plan.  No orders of the defined types were placed in this encounter.   Meds ordered this encounter  Medications  . Erenumab-aooe 140 MG/ML SOAJ    Sig: Inject 140 mg into the skin every 30 (thirty) days.    Dispense:  3 pen    Refill:  3    Order Specific Question:   Supervising Provider    Answer:   Melvenia Beam [id number]     Follow Up Instructions:  I discussed the assessment and treatment plan with the patient. The patient was provided an opportunity to ask questions and all were answered. The patient agreed with the plan and demonstrated an understanding of the instructions.   The patient was advised to call back or seek an in-person evaluation if the symptoms worsen or if the condition fails to improve  as anticipated.  I provided 20 minutes of non-face-to-face time during this encounter.  Patient and patient's wife, [name], are located at their place of residence during video conference.  Provider is located at her place of residence.  [name], CMA helped to facilitate visit.   Debbora Presto, NP

## 2019-04-30 NOTE — Telephone Encounter (Signed)
Pending approval for Aimovig 140 mg/mL Key: [id number] Rx #: [id number] ICD 10 code: D02.284   I will update once a decision has been made.

## 2019-05-01 ENCOUNTER — Other Ambulatory Visit: Payer: Self-pay | Admitting: Neurology

## 2019-05-01 NOTE — Telephone Encounter (Signed)
Received an approval letter for Aimovig 140 mg/mL. Approved from 04/30/2019-07/31/2019. A copy of the approval letter has been faxed to the pharmacy below. Patient has been notified via mychart. Confirmation fax has been received.        [id number] - [place], [place] - [address] [phone] (Phone) [phone] (Fax)

## 2019-05-02 NOTE — Progress Notes (Signed)
Made any corrections needed, and agree with history, physical, neuro exam,assessment and plan as stated.     Arabela Basaldua, MD [facility]  

## 2019-05-26 ENCOUNTER — Ambulatory Visit: Payer: BC Managed Care – PPO | Admitting: Adult Health

## 2019-07-10 ENCOUNTER — Telehealth: Payer: Self-pay | Admitting: Family Medicine

## 2019-07-10 NOTE — Telephone Encounter (Signed)
I called patient regarding rescheduling her 9/14 appointment due to Np time off. I offered patient a virtual visit via her MyChart account for 8/25 at 8am. Patient accepted and verbalized understanding of how the visit will be conducted.

## 2019-07-15 ENCOUNTER — Encounter: Payer: Self-pay | Admitting: Family Medicine

## 2019-07-15 ENCOUNTER — Telehealth (INDEPENDENT_AMBULATORY_CARE_PROVIDER_SITE_OTHER): Payer: BC Managed Care – PPO | Admitting: Family Medicine

## 2019-07-15 DIAGNOSIS — R42 Dizziness and giddiness: Secondary | ICD-10-CM

## 2019-07-15 DIAGNOSIS — G43009 Migraine without aura, not intractable, without status migrainosus: Secondary | ICD-10-CM | POA: Diagnosis not present

## 2019-07-15 MED ORDER — MECLIZINE HCL 12.5 MG PO TABS: 12.5000 mg | ORAL_TABLET | Freq: Three times a day (TID) | ORAL | 0 refills | Status: DC | PRN
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
## 2019-08-04 ENCOUNTER — Ambulatory Visit: Payer: BC Managed Care – PPO | Admitting: Family Medicine

## 2019-08-14 ENCOUNTER — Telehealth: Payer: Self-pay | Admitting: *Deleted

## 2019-08-14 NOTE — Telephone Encounter (Signed)
Initiated CMM key # [id number] for aimovig 140mg /ml.  G43.009 migraines, R42 vertigo. Determination pending.

## 2019-08-19 NOTE — Telephone Encounter (Addendum)
Received approval for aimovig 140mg /ml  08-14-19 thru 08-13-20.  [phone].  [place] [facility] [id number].  My chart email sent to pt.  Fax confirmation received. to [facility] [phone].

## 2019-09-10 ENCOUNTER — Other Ambulatory Visit: Payer: Self-pay

## 2019-09-10 DIAGNOSIS — Z20822 Contact with and (suspected) exposure to covid-19: Secondary | ICD-10-CM

## 2019-09-11 LAB — NOVEL CORONAVIRUS, NAA: SARS-CoV-2, NAA: NOT DETECTED

## 2019-10-07 ENCOUNTER — Encounter: Payer: Self-pay | Admitting: Family Medicine

## 2019-12-08 ENCOUNTER — Other Ambulatory Visit: Payer: BC Managed Care – PPO

## 2019-12-08 ENCOUNTER — Ambulatory Visit: Payer: BC Managed Care – PPO | Attending: Internal Medicine

## 2019-12-08 DIAGNOSIS — Z20822 Contact with and (suspected) exposure to covid-19: Secondary | ICD-10-CM

## 2019-12-09 LAB — NOVEL CORONAVIRUS, NAA: SARS-CoV-2, NAA: NOT DETECTED

## 2019-12-22 ENCOUNTER — Ambulatory Visit: Payer: BC Managed Care – PPO | Attending: Internal Medicine

## 2019-12-22 DIAGNOSIS — Z20822 Contact with and (suspected) exposure to covid-19: Secondary | ICD-10-CM

## 2019-12-23 LAB — NOVEL CORONAVIRUS, NAA: SARS-CoV-2, NAA: NOT DETECTED

## 2020-01-09 ENCOUNTER — Ambulatory Visit: Payer: BC Managed Care – PPO | Attending: Internal Medicine

## 2020-01-09 DIAGNOSIS — Z23 Encounter for immunization: Secondary | ICD-10-CM | POA: Insufficient documentation

## 2020-01-09 NOTE — Progress Notes (Signed)
   Covid-19 Vaccination Clinic  Name:  [name]    MRN: [id number] DOB: Nov 11, 1981  01/09/2020  [name] was observed post Covid-19 immunization for 15 minutes without incidence. She was provided with Vaccine Information Sheet and instruction to access the V-Safe system.   [name] was instructed to call 911 with any severe reactions post vaccine: Marland Kitchen Difficulty breathing  . Swelling of your face and throat  . A fast heartbeat  . A bad rash all over your body  . Dizziness and weakness    Immunizations Administered    Name Date Dose VIS Date Route   Pfizer COVID-19 Vaccine 01/09/2020 10:27 AM 0.3 mL 10/31/2019 Intramuscular   Manufacturer: [place]   Lot: [id number]   [place]: [id number]

## 2020-02-03 ENCOUNTER — Ambulatory Visit: Payer: BC Managed Care – PPO | Attending: Internal Medicine

## 2020-02-03 DIAGNOSIS — Z23 Encounter for immunization: Secondary | ICD-10-CM

## 2020-02-03 NOTE — Progress Notes (Signed)
   Covid-19 Vaccination Clinic  Name:  [name]    MRN: [id number] DOB: 07-Jul-1981  02/03/2020  [name] was observed post Covid-19 immunization for 15 minutes without incident. She was provided with Vaccine Information Sheet and instruction to access the V-Safe system.   [name] was instructed to call 911 with any severe reactions post vaccine: Marland Kitchen Difficulty breathing  . Swelling of face and throat  . A fast heartbeat  . A bad rash all over body  . Dizziness and weakness   Immunizations Administered    Name Date Dose VIS Date Route   Pfizer COVID-19 Vaccine 02/03/2020  8:28 AM 0.3 mL 10/31/2019 Intramuscular   Manufacturer: [place]   Lot: [id number]   [place]: [id number]

## 2020-03-09 ENCOUNTER — Telehealth (INDEPENDENT_AMBULATORY_CARE_PROVIDER_SITE_OTHER): Payer: BC Managed Care – PPO | Admitting: Family Medicine

## 2020-03-09 ENCOUNTER — Encounter: Payer: Self-pay | Admitting: Family Medicine

## 2020-03-09 DIAGNOSIS — G43009 Migraine without aura, not intractable, without status migrainosus: Secondary | ICD-10-CM | POA: Diagnosis not present

## 2020-03-09 DIAGNOSIS — R42 Dizziness and giddiness: Secondary | ICD-10-CM

## 2020-03-09 MED ORDER — NURTEC 75 MG PO TBDP: 75.0000 mg | ORAL_TABLET | Freq: Every day | ORAL | 11 refills | Status: DC | PRN

## 2020-03-09 MED ORDER — ERENUMAB-AOOE 140 MG/ML ~~LOC~~ SOAJ: 140.0000 mg | SUBCUTANEOUS | 3 refills | Status: DC
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
## 2020-03-10 ENCOUNTER — Telehealth: Payer: Self-pay | Admitting: *Deleted

## 2020-03-10 NOTE — Telephone Encounter (Signed)
Nurtec PA,  key: [id number], G43.009. Failed: Imitrex, propranolol, Midrin, Fioricet, zomig, toradol.  Your information has been submitted to [place]. To check for an updated outcome later, reopen this PA request from your dashboard. If [employer] has not responded to your request within 24 hours, contact [place] at [phone].

## 2020-03-11 ENCOUNTER — Encounter: Payer: Self-pay | Admitting: *Deleted

## 2020-03-11 NOTE — Telephone Encounter (Signed)
Nurtec approved 03/10/2020 - 03/10/2021. Approval faxed to [place], f [phone]. Sent patient my chart.

## 2020-03-26 ENCOUNTER — Encounter: Payer: Self-pay | Admitting: Family Medicine

## 2020-06-16 ENCOUNTER — Other Ambulatory Visit: Payer: Self-pay | Admitting: Orthopedic Surgery

## 2020-06-17 ENCOUNTER — Encounter (HOSPITAL_BASED_OUTPATIENT_CLINIC_OR_DEPARTMENT_OTHER): Payer: Self-pay | Admitting: Orthopedic Surgery

## 2020-06-17 ENCOUNTER — Other Ambulatory Visit: Payer: Self-pay

## 2020-06-17 NOTE — Progress Notes (Signed)

## 2020-06-21 ENCOUNTER — Other Ambulatory Visit (HOSPITAL_COMMUNITY)
Admission: RE | Admit: 2020-06-21 | Discharge: 2020-06-21 | Disposition: A | Discharge status: Home / Self Care | Payer: BC Managed Care – PPO | Source: Ambulatory Visit | Attending: Orthopedic Surgery | Admitting: Orthopedic Surgery

## 2020-06-21 DIAGNOSIS — Z01812 Encounter for preprocedural laboratory examination: Secondary | ICD-10-CM | POA: Diagnosis present

## 2020-06-21 DIAGNOSIS — Z20822 Contact with and (suspected) exposure to covid-19: Secondary | ICD-10-CM | POA: Diagnosis not present

## 2020-06-21 LAB — SARS CORONAVIRUS 2 (TAT 6-24 HRS): SARS Coronavirus 2: NEGATIVE

## 2020-06-24 ENCOUNTER — Encounter (HOSPITAL_BASED_OUTPATIENT_CLINIC_OR_DEPARTMENT_OTHER): Payer: Self-pay | Admitting: Orthopedic Surgery

## 2020-06-24 ENCOUNTER — Ambulatory Visit (HOSPITAL_BASED_OUTPATIENT_CLINIC_OR_DEPARTMENT_OTHER): Payer: Worker's Compensation | Admitting: Anesthesiology

## 2020-06-24 ENCOUNTER — Other Ambulatory Visit: Payer: Self-pay

## 2020-06-24 ENCOUNTER — Encounter (HOSPITAL_BASED_OUTPATIENT_CLINIC_OR_DEPARTMENT_OTHER)
Admission: RE | Disposition: A | Discharge status: Home / Self Care | Payer: Self-pay | Source: Home / Self Care | Attending: Orthopedic Surgery

## 2020-06-24 ENCOUNTER — Ambulatory Visit (HOSPITAL_BASED_OUTPATIENT_CLINIC_OR_DEPARTMENT_OTHER)
Admission: RE | Admit: 2020-06-24 | Discharge: 2020-06-24 | Disposition: A | Discharge status: Home / Self Care | Payer: Worker's Compensation | Attending: Orthopedic Surgery | Admitting: Orthopedic Surgery

## 2020-06-24 DIAGNOSIS — Z79899 Other long term (current) drug therapy: Secondary | ICD-10-CM | POA: Diagnosis not present

## 2020-06-24 DIAGNOSIS — F419 Anxiety disorder, unspecified: Secondary | ICD-10-CM | POA: Insufficient documentation

## 2020-06-24 DIAGNOSIS — Y831 Surgical operation with implant of artificial internal device as the cause of abnormal reaction of the patient, or of later complication, without mention of misadventure at the time of the procedure: Secondary | ICD-10-CM | POA: Diagnosis not present

## 2020-06-24 DIAGNOSIS — T8484XA Pain due to internal orthopedic prosthetic devices, implants and grafts, initial encounter: Secondary | ICD-10-CM | POA: Diagnosis present

## 2020-06-24 DIAGNOSIS — Z6841 Body Mass Index (BMI) 40.0 and over, adult: Secondary | ICD-10-CM | POA: Insufficient documentation

## 2020-06-24 DIAGNOSIS — Z87891 Personal history of nicotine dependence: Secondary | ICD-10-CM | POA: Diagnosis not present

## 2020-06-24 DIAGNOSIS — J45909 Unspecified asthma, uncomplicated: Secondary | ICD-10-CM | POA: Diagnosis not present

## 2020-06-24 HISTORY — PX: HARDWARE REMOVAL: SHX979

## 2020-06-24 LAB — POCT PREGNANCY, URINE: Preg Test, Ur: NEGATIVE

## 2020-06-24 SURGERY — REMOVAL, HARDWARE
Anesthesia: Monitor Anesthesia Care | Site: Arm Lower | Laterality: Right

## 2020-06-24 MED ORDER — LIDOCAINE 2% (20 MG/ML) 5 ML SYRINGE
INTRAMUSCULAR | Status: DC | PRN
  Administered 2020-06-24: 75 mg via INTRAVENOUS

## 2020-06-24 MED ORDER — PROPOFOL 500 MG/50ML IV EMUL
INTRAVENOUS | Status: DC | PRN
  Administered 2020-06-24: 100 ug/kg/min via INTRAVENOUS

## 2020-06-24 MED ORDER — MIDAZOLAM HCL 2 MG/2ML IJ SOLN
INTRAMUSCULAR | Status: AC
  Filled 2020-06-24: qty 2

## 2020-06-24 MED ORDER — MIDAZOLAM HCL 5 MG/5ML IJ SOLN
INTRAMUSCULAR | Status: DC | PRN
  Administered 2020-06-24: 1 mg via INTRAVENOUS

## 2020-06-24 MED ORDER — ONDANSETRON HCL 4 MG/2ML IJ SOLN
INTRAMUSCULAR | Status: AC
  Filled 2020-06-24: qty 2

## 2020-06-24 MED ORDER — ACETAMINOPHEN 500 MG PO TABS
ORAL_TABLET | ORAL | Status: AC
  Filled 2020-06-24: qty 2

## 2020-06-24 MED ORDER — OXYCODONE HCL 5 MG PO TABS
ORAL_TABLET | ORAL | Status: AC
  Filled 2020-06-24: qty 1

## 2020-06-24 MED ORDER — FENTANYL CITRATE (PF) 100 MCG/2ML IJ SOLN
INTRAMUSCULAR | Status: AC
  Filled 2020-06-24: qty 2

## 2020-06-24 MED ORDER — LACTATED RINGERS IV SOLN: INTRAVENOUS | Status: DC

## 2020-06-24 MED ORDER — PROPOFOL 500 MG/50ML IV EMUL
INTRAVENOUS | Status: AC
  Filled 2020-06-24: qty 50

## 2020-06-24 MED ORDER — DIPHENHYDRAMINE HCL 50 MG/ML IJ SOLN
INTRAMUSCULAR | Status: AC
  Filled 2020-06-24: qty 1

## 2020-06-24 MED ORDER — PHENYLEPHRINE 40 MCG/ML (10ML) SYRINGE FOR IV PUSH (FOR BLOOD PRESSURE SUPPORT)
PREFILLED_SYRINGE | INTRAVENOUS | Status: AC
  Filled 2020-06-24: qty 10

## 2020-06-24 MED ORDER — ONDANSETRON HCL 4 MG/2ML IJ SOLN
INTRAMUSCULAR | Status: DC | PRN
  Administered 2020-06-24: 4 mg via INTRAVENOUS

## 2020-06-24 MED ORDER — MIDAZOLAM HCL 2 MG/2ML IJ SOLN
2.0000 mg | Freq: Once | INTRAMUSCULAR | Status: AC
  Administered 2020-06-24: 2 mg via INTRAVENOUS

## 2020-06-24 MED ORDER — CEFAZOLIN SODIUM-DEXTROSE 2-4 GM/100ML-% IV SOLN
INTRAVENOUS | Status: AC
  Filled 2020-06-24: qty 100

## 2020-06-24 MED ORDER — EPHEDRINE 5 MG/ML INJ
INTRAVENOUS | Status: AC
  Filled 2020-06-24: qty 10

## 2020-06-24 MED ORDER — DIPHENHYDRAMINE HCL 50 MG/ML IJ SOLN
INTRAMUSCULAR | Status: DC | PRN
Start: 2020-06-24 — End: 2020-06-24
  Administered 2020-06-24: 6.25 mg via INTRAVENOUS

## 2020-06-24 MED ORDER — OXYCODONE HCL 5 MG PO TABS
5.0000 mg | ORAL_TABLET | Freq: Once | ORAL | Status: AC
  Administered 2020-06-24: 5 mg via ORAL

## 2020-06-24 MED ORDER — FENTANYL CITRATE (PF) 100 MCG/2ML IJ SOLN
100.0000 ug | Freq: Once | INTRAMUSCULAR | Status: AC
  Administered 2020-06-24: 100 ug via INTRAVENOUS

## 2020-06-24 MED ORDER — HYDROMORPHONE HCL 1 MG/ML IJ SOLN
INTRAMUSCULAR | Status: AC
  Filled 2020-06-24: qty 0.5

## 2020-06-24 MED ORDER — OXYCODONE-ACETAMINOPHEN 5-325 MG PO TABS: ORAL_TABLET | ORAL | 0 refills | Status: DC

## 2020-06-24 MED ORDER — CEFAZOLIN SODIUM-DEXTROSE 2-4 GM/100ML-% IV SOLN
2.0000 g | INTRAVENOUS | Status: AC
  Administered 2020-06-24: 3 g via INTRAVENOUS

## 2020-06-24 MED ORDER — ACETAMINOPHEN 500 MG PO TABS
1000.0000 mg | ORAL_TABLET | Freq: Once | ORAL | Status: AC
  Administered 2020-06-24: 1000 mg via ORAL

## 2020-06-24 MED ORDER — FENTANYL CITRATE (PF) 100 MCG/2ML IJ SOLN
INTRAMUSCULAR | Status: DC | PRN
  Administered 2020-06-24 (×2): 50 ug via INTRAVENOUS

## 2020-06-24 MED ORDER — HYDROMORPHONE HCL 1 MG/ML IJ SOLN
0.5000 mg | Freq: Once | INTRAMUSCULAR | Status: AC
  Administered 2020-06-24: 0.5 mg via INTRAVENOUS

## 2020-06-24 MED ORDER — SUCCINYLCHOLINE CHLORIDE 200 MG/10ML IV SOSY
PREFILLED_SYRINGE | INTRAVENOUS | Status: AC
  Filled 2020-06-24: qty 10

## 2020-06-24 MED ORDER — LIDOCAINE 2% (20 MG/ML) 5 ML SYRINGE
INTRAMUSCULAR | Status: AC
  Filled 2020-06-24: qty 5

## 2020-06-24 MED ORDER — CEFAZOLIN SODIUM-DEXTROSE 1-4 GM/50ML-% IV SOLN
INTRAVENOUS | Status: AC
  Filled 2020-06-24: qty 50

## 2020-06-24 MED ORDER — FENTANYL CITRATE (PF) 100 MCG/2ML IJ SOLN
25.0000 ug | INTRAMUSCULAR | Status: DC | PRN
  Administered 2020-06-24 (×3): 50 ug via INTRAVENOUS

## 2020-06-24 SURGICAL SUPPLY — 47 items
APL PRP STRL LF DISP 70% ISPRP (MISCELLANEOUS) ×1
BLADE MINI RND TIP GREEN BEAV (BLADE) IMPLANT
BLADE SURG 15 STRL LF DISP TIS (BLADE) ×2 IMPLANT
BLADE SURG 15 STRL SS (BLADE) ×4
BNDG CMPR 9X4 STRL LF SNTH (GAUZE/BANDAGES/DRESSINGS) ×1
BNDG COHESIVE 1X5 TAN STRL LF (GAUZE/BANDAGES/DRESSINGS) IMPLANT
BNDG ELASTIC 2X5.8 VLCR STR LF (GAUZE/BANDAGES/DRESSINGS) IMPLANT
BNDG ELASTIC 3X5.8 VLCR STR LF (GAUZE/BANDAGES/DRESSINGS) ×2 IMPLANT
BNDG ESMARK 4X9 LF (GAUZE/BANDAGES/DRESSINGS) ×2 IMPLANT
BNDG GAUZE ELAST 4 BULKY (GAUZE/BANDAGES/DRESSINGS) ×2 IMPLANT
CHLORAPREP W/TINT 26 (MISCELLANEOUS) ×2 IMPLANT
CORD BIPOLAR FORCEPS 12FT (ELECTRODE) ×2 IMPLANT
COVER BACK TABLE 60X90IN (DRAPES) ×2 IMPLANT
COVER MAYO STAND STRL (DRAPES) ×2 IMPLANT
COVER WAND RF STERILE (DRAPES) IMPLANT
CUFF TOURN SGL QUICK 18X4 (TOURNIQUET CUFF) ×2 IMPLANT
DECANTER SPIKE VIAL GLASS SM (MISCELLANEOUS) ×2 IMPLANT
DRAPE EXTREMITY T 121X128X90 (DISPOSABLE) ×2 IMPLANT
DRAPE SURG 17X23 STRL (DRAPES) ×2 IMPLANT
DRSG PAD ABDOMINAL 8X10 ST (GAUZE/BANDAGES/DRESSINGS) ×2 IMPLANT
GAUZE SPONGE 4X4 12PLY STRL (GAUZE/BANDAGES/DRESSINGS) ×2 IMPLANT
GAUZE XEROFORM 1X8 LF (GAUZE/BANDAGES/DRESSINGS) ×2 IMPLANT
GLOVE BIOGEL PI IND STRL 8 (GLOVE) ×2 IMPLANT
GLOVE BIOGEL PI INDICATOR 8 (GLOVE) ×2
GLOVE SURG SS PI 7.5 STRL IVOR (GLOVE) ×4 IMPLANT
GOWN STRL REUS W/ TWL LRG LVL3 (GOWN DISPOSABLE) ×1 IMPLANT
GOWN STRL REUS W/TWL LRG LVL3 (GOWN DISPOSABLE) ×2
GOWN STRL REUS W/TWL XL LVL3 (GOWN DISPOSABLE) ×2 IMPLANT
NEEDLE HYPO 25X1 1.5 SAFETY (NEEDLE) IMPLANT
NS IRRIG 1000ML POUR BTL (IV SOLUTION) ×2 IMPLANT
PACK BASIN DAY SURGERY FS (CUSTOM PROCEDURE TRAY) ×2 IMPLANT
PAD CAST 3X4 CTTN HI CHSV (CAST SUPPLIES) IMPLANT
PADDING CAST ABS 4INX4YD NS (CAST SUPPLIES) ×1
PADDING CAST ABS COTTON 4X4 ST (CAST SUPPLIES) ×1 IMPLANT
PADDING CAST COTTON 3X4 STRL (CAST SUPPLIES)
SLING ARM FOAM STRAP LRG (SOFTGOODS) ×2 IMPLANT
SPLINT PLASTER CAST XFAST 3X15 (CAST SUPPLIES) ×10 IMPLANT
SPLINT PLASTER XTRA FASTSET 3X (CAST SUPPLIES) ×10
STOCKINETTE 4X48 STRL (DRAPES) ×4 IMPLANT
STRIP CLOSURE SKIN 1/4X4 (GAUZE/BANDAGES/DRESSINGS) IMPLANT
SUT ETHILON 4 0 PS 2 18 (SUTURE) ×4 IMPLANT
SUT MNCRL AB 4-0 PS2 18 (SUTURE) IMPLANT
SUT VIC AB 3-0 FS2 27 (SUTURE) IMPLANT
SYR BULB EAR ULCER 3OZ GRN STR (SYRINGE) ×2 IMPLANT
SYR CONTROL 10ML LL (SYRINGE) IMPLANT
TOWEL GREEN STERILE FF (TOWEL DISPOSABLE) ×4 IMPLANT
UNDERPAD 30X36 HEAVY ABSORB (UNDERPADS AND DIAPERS) ×2 IMPLANT

## 2020-06-24 NOTE — Transfer of Care (Signed)
Immediate Anesthesia Transfer of Care Note  Patient: [name]  Procedure(s) Performed: HARDWARE REMOVAL RIGHT FOREARM (Right Arm Lower)  Patient Location: PACU  Anesthesia Type:MAC combined with regional for post-op pain  Level of Consciousness: awake, alert  and oriented  Airway & Oxygen Therapy: Patient Spontanous Breathing and Patient connected to face mask oxygen  Post-op Assessment: Report given to RN and Post -op Vital signs reviewed and stable  Post vital signs: Reviewed and stable  Last Vitals:  Vitals Value Taken Time  BP    Temp    Pulse    Resp    SpO2      Last Pain:  Vitals:   06/24/20 0713  PainSc: 3       Patients Stated Pain Goal: 3 (80/88/11 0315)  Complications: No complications documented.
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
## 2020-06-24 NOTE — Anesthesia Preprocedure Evaluation (Addendum)
Anesthesia Evaluation  Patient identified by MRN, date of birth, ID band Patient awake    Reviewed: Allergy & Precautions, NPO status , Patient's Chart, lab work & pertinent test results  Airway Mallampati: I  TM Distance: >3 FB Neck ROM: Full    Dental no notable dental hx. (+) Teeth Intact   Pulmonary asthma , former smoker,    Pulmonary exam normal breath sounds clear to auscultation       Cardiovascular negative cardio ROS Normal cardiovascular exam Rhythm:Regular Rate:Normal     Neuro/Psych  Headaches, PSYCHIATRIC DISORDERS Anxiety    GI/Hepatic negative GI ROS, Neg liver ROS,   Endo/Other  Morbid obesity (BMI 47)  Renal/GU negative Renal ROS  negative genitourinary   Musculoskeletal negative musculoskeletal ROS (+)   Abdominal   Peds  Hematology negative hematology ROS (+)   Anesthesia Other Findings   Reproductive/Obstetrics                            Anesthesia Physical Anesthesia Plan  ASA: III  Anesthesia Plan: MAC and Regional   Post-op Pain Management:    Induction: Intravenous  PONV Risk Score and Plan: 2 and Ondansetron, Dexamethasone and Midazolam  Airway Management Planned: Natural Airway  Additional Equipment:   Intra-op Plan:   Post-operative Plan:   Informed Consent: I have reviewed the patients History and Physical, chart, labs and discussed the procedure including the risks, benefits and alternatives for the proposed anesthesia with the patient or authorized representative who has indicated his/her understanding and acceptance.     Dental advisory given  Plan Discussed with: CRNA  Anesthesia Plan Comments: (Plan regional anesthesia with Exparel as previous blocks lasted less than 12 hours.  )      Anesthesia Quick Evaluation

## 2020-06-24 NOTE — Op Note (Signed)
I assisted Surgeon(s) and Role:    * Leanora Cover, MD - Primary    Daryll Brod, MD on the Procedure(s): [place] on 06/24/2020.  I provided assistance on this case as follows: set up, approach, removal of plate and screws, closure and application of the dressings and splint. Electronically signed by: Daryll Brod, MD Date: 06/24/2020 Time: 9:48 AM

## 2020-06-24 NOTE — Progress Notes (Signed)
Assisted Dr. Woodrum with right, ultrasound guided, supraclavicular block. Side rails up, monitors on throughout procedure. See vital signs in flow sheet. Tolerated Procedure well. 

## 2020-06-24 NOTE — Op Note (Signed)
NAME: [name] MEDICAL RECORD NO: [id number] DATE OF BIRTH: [birth year] FACILITY: [facility] LOCATION: [facility] PHYSICIAN: Tennis Must, MD   OPERATIVE REPORT   DATE OF PROCEDURE: 06/24/20    PREOPERATIVE DIAGNOSIS:   Retained hardware right forearm   POSTOPERATIVE DIAGNOSIS:   Retained hardware right forearm   PROCEDURE:   Removal of right ulnar plate   SURGEON:  Leanora Cover, M.D.   ASSISTANT: Daryll Brod, MD   ANESTHESIA:  Regional with sedation   INTRAVENOUS FLUIDS:  Per anesthesia flow sheet.   ESTIMATED BLOOD LOSS:  Minimal.   COMPLICATIONS:  None.   SPECIMENS:  none   TOURNIQUET TIME:    Total Tourniquet Time Documented: Upper Arm (Right) - 39 minutes Total: Upper Arm (Right) - 39 minutes    DISPOSITION:  Stable to PACU.   INDICATIONS: 39 year old male status post ulnar shortening osteotomy.  She has continued pain in the forearm.  She wishes to have the plate removed. Risks, benefits and alternatives of surgery were discussed including the risks of blood loss, infection, damage to nerves, vessels, tendons, ligaments, bone for surgery, need for additional surgery, complications with wound healing, continued pain, stiffness.  She voiced understanding of these risks and elected to proceed.  OPERATIVE COURSE:  After being identified preoperatively by myself,  the patient and I agreed on the procedure and site of the procedure.  The surgical site was marked.  Surgical consent had been signed. She was given IV antibiotics as preoperative antibiotic prophylaxis. She was transferred to the operating room and placed on the operating table in supine position with the Right upper extremity on an arm board.  Sedation was induced by the anesthesiologist. A regional block had been performed by anesthesia in preoperative holding.   Right upper extremity was prepped and draped in normal sterile orthopedic fashion.  A surgical pause was performed between  the surgeons, anesthesia, and operating room staff and all were in agreement as to the patient, procedure, and site of procedure.  Tourniquet at the proximal aspect of the extremity was inflated to 250 mmHg after exsanguination of the arm with an Esmarch bandage.    Previous incision was followed.  This is carried in subcutaneous tissues by spreading technique.  The FCU muscle was retracted volarly.  The plate was identified.  Was cleared of soft tissue coverage.  The osteotomy site was well-healed.  The screws from the plate were removed.  The plate was removed.  Rongeurs were used to smooth out the bone where it had started to grow up around the screw edges.  The area was examined.  I did not see the dorsal sensory branch of the ulnar nerve.  This was likely more distal to the incision.  The wound was copiously irrigated with sterile saline.  Inverted interrupted 4-0 Vicryl suture was used in the subcutaneous tissues and skin was closed with 4-0 nylon in a horizontal mattress fashion.  It was dressed with sterile Xeroform 4 x 4's and wrapped with a Kerlix bandage.  A volar splint was placed and wrapped with Kerlix and Ace bandage.  The tourniquet was deflated at 39 minutes.  Fingertips were pink with brisk capillary refill after deflation of tourniquet.  The operative  drapes were broken down.  The patient was awoken from anesthesia safely.  She was transferred back to the stretcher and taken to PACU in stable condition.  I will see her back in the office in 1 week for postoperative followup.  I will give her a prescription for Percocet 5/325 1-2 tabs PO q6 hours prn pain, dispense # 20.   Leanora Cover, MD Electronically signed, 06/24/20

## 2020-06-24 NOTE — Discharge Instructions (Addendum)
[facility] Instructions Hand Surgery  Wound Care: Keep your hand elevated above the level of your heart.  Do not allow it to dangle by your side.  Keep the dressing dry and do not remove it unless your doctor advises you to do so.  He will usually change it at the time of your post-op visit.  Moving your fingers is advised to stimulate circulation but will depend on the site of your surgery.  If you have a splint applied, your doctor will advise you regarding movement.  Activity: Do not drive or operate machinery today.  Rest today and then you may return to your normal activity and work as indicated by your physician.  Diet:  Drink liquids today or eat a light diet.  You may resume a regular diet tomorrow.    General expectations: Pain for two to three days. Fingers may become slightly swollen.  Call your doctor if any of the following occur: Severe pain not relieved by pain medication. Elevated temperature. Dressing soaked with blood. Inability to move fingers. White or bluish color to fingers.   Post Anesthesia Home Care Instructions  Activity: Get plenty of rest for the remainder of the day. A responsible individual must stay with you for 24 hours following the procedure.  For the next 24 hours, DO NOT: -Drive a car -Paediatric nurse -Drink alcoholic beverages -Take any medication unless instructed by your physician -Make any legal decisions or sign important papers.  Meals: Start with liquid foods such as gelatin or soup. Progress to regular foods as tolerated. Avoid greasy, spicy, heavy foods. If nausea and/or vomiting occur, drink only clear liquids until the nausea and/or vomiting subsides. Call your physician if vomiting continues.  Special Instructions/Symptoms: Your throat may feel dry or sore from the anesthesia or the breathing tube placed in your throat during surgery. If this causes discomfort, gargle with warm salt water. The discomfort should disappear within  24 hours.  If you had a scopolamine patch placed behind your ear for the management of post- operative nausea and/or vomiting:  1. The medication in the patch is effective for 72 hours, after which it should be removed.  Wrap patch in a tissue and discard in the trash. Wash hands thoroughly with soap and water. 2. You may remove the patch earlier than 72 hours if you experience unpleasant side effects which may include dry mouth, dizziness or visual disturbances. 3. Avoid touching the patch. Wash your hands with soap and water after contact with the patch.    Regional Anesthesia Blocks  1. Numbness or the inability to move the "blocked" extremity may last from 3-48 hours after placement. The length of time depends on the medication injected and your individual response to the medication. If the numbness is not going away after 48 hours, call your surgeon.  2. The extremity that is blocked will need to be protected until the numbness is gone and the  Strength has returned. Because you cannot feel it, you will need to take extra care to avoid injury. Because it may be weak, you may have difficulty moving it or using it. You may not know what position it is in without looking at it while the block is in effect.  3. For blocks in the legs and feet, returning to weight bearing and walking needs to be done carefully. You will need to wait until the numbness is entirely gone and the strength has returned. You should be able to move your leg  and foot normally before you try and bear weight or walk. You will need someone to be with you when you first try to ensure you do not fall and possibly risk injury.  4. Bruising and tenderness at the needle site are common side effects and will resolve in a few days.  5. Persistent numbness or new problems with movement should be communicated to the surgeon or the [place] [phone] [place] [phone]).  No Tylenol until 4pm  today if needed.

## 2020-06-25 ENCOUNTER — Encounter (HOSPITAL_BASED_OUTPATIENT_CLINIC_OR_DEPARTMENT_OTHER): Payer: Self-pay | Admitting: Orthopedic Surgery

## 2020-06-27 ENCOUNTER — Encounter (HOSPITAL_BASED_OUTPATIENT_CLINIC_OR_DEPARTMENT_OTHER): Payer: Self-pay | Admitting: Orthopedic Surgery

## 2020-06-27 MED ORDER — BUPIVACAINE HCL (PF) 0.5 % IJ SOLN
INTRAMUSCULAR | Status: DC | PRN
  Administered 2020-06-24: 20 mL via PERINEURAL

## 2020-06-27 MED ORDER — ROPIVACAINE HCL 5 MG/ML IJ SOLN
INTRAMUSCULAR | Status: DC | PRN
  Administered 2020-06-24: 20 mL via PERINEURAL

## 2020-06-27 MED ORDER — BUPIVACAINE LIPOSOME 1.3 % IJ SUSP
INTRAMUSCULAR | Status: DC | PRN
  Administered 2020-06-24: 10 mL via PERINEURAL

## 2020-06-27 NOTE — Anesthesia Procedure Notes (Signed)
Anesthesia Regional Block: Supraclavicular block   Pre-Anesthetic Checklist: ,, timeout performed, Correct Patient, Correct Site, Correct Laterality, Correct Procedure, Correct Position, site marked, Risks and benefits discussed,  Surgical consent,  Pre-op evaluation,  At surgeon's request and post-op pain management  Laterality: Right  Prep: Maximum Sterile Barrier Precautions used, chloraprep       Needles:  Injection technique: Single-shot  Needle Type: Echogenic Stimulator Needle     Needle Length: 9cm  Needle Gauge: 22     Additional Needles:   Procedures:,,,, ultrasound used (permanent image in chart),,,,  Narrative:  Start time: 06/27/2020 1:06 PM End time: 06/27/2020 1:16 PM Injection made incrementally with aspirations every 5 mL.  Performed by: Personally  Anesthesiologist: Freddrick March, MD  Additional Notes: Monitors applied. No increased pain on injection. No increased resistance to injection. Injection made in 5cc increments. Good needle visualization. Patient tolerated procedure well.

## 2020-06-27 NOTE — Anesthesia Procedure Notes (Signed)
Anesthesia Regional Block: Supraclavicular block   Pre-Anesthetic Checklist: ,, timeout performed, Correct Patient, Correct Site, Correct Laterality, Correct Procedure, Correct Position, site marked, Risks and benefits discussed,  Surgical consent,  Pre-op evaluation,  At surgeon's request and post-op pain management  Laterality: Right  Prep: Maximum Sterile Barrier Precautions used, chloraprep       Needles:  Injection technique: Single-shot  Needle Type: Echogenic Stimulator Needle     Needle Length: 9cm  Needle Gauge: 22     Additional Needles:   Procedures:,,,, ultrasound used (permanent image in chart),,,,  Narrative:  Start time: 06/27/2020 8:27 AM End time: 06/27/2020 8:37 AM Injection made incrementally with aspirations every 5 mL.  Performed by: Personally  Anesthesiologist: Freddrick March, MD  Additional Notes: Monitors applied. No increased pain on injection. No increased resistance to injection. Injection made in 5cc increments. Good needle visualization. Patient tolerated procedure well.

## 2020-06-27 NOTE — Anesthesia Postprocedure Evaluation (Signed)
Anesthesia Post Note  Patient: [name]  Procedure(s) Performed: HARDWARE REMOVAL RIGHT FOREARM (Right Arm Lower)     Patient location during evaluation: PACU Anesthesia Type: Regional and MAC Level of consciousness: awake and alert Pain management: pain level controlled Vital Signs Assessment: post-procedure vital signs reviewed and stable Respiratory status: spontaneous breathing, nonlabored ventilation, respiratory function stable and patient connected to nasal cannula oxygen Cardiovascular status: stable and blood pressure returned to baseline Postop Assessment: no apparent nausea or vomiting Anesthetic complications: no   No complications documented.  Last Vitals:  Vitals:   06/24/20 1335 06/24/20 1420  BP: 127/90 128/79  Pulse: 70 61  Resp: 14 16  Temp:  37 C  SpO2: 99% 96%    Last Pain:  Vitals:   06/24/20 1420  PainSc: 0-No pain                 Kiyani Jernigan L Makenzee Choudhry

## 2020-09-27 ENCOUNTER — Other Ambulatory Visit (HOSPITAL_BASED_OUTPATIENT_CLINIC_OR_DEPARTMENT_OTHER): Payer: Self-pay

## 2020-09-27 DIAGNOSIS — F515 Nightmare disorder: Secondary | ICD-10-CM

## 2020-09-27 DIAGNOSIS — G478 Other sleep disorders: Secondary | ICD-10-CM

## 2020-09-27 DIAGNOSIS — G4752 REM sleep behavior disorder: Secondary | ICD-10-CM

## 2020-09-27 DIAGNOSIS — F513 Sleepwalking [somnambulism]: Secondary | ICD-10-CM

## 2020-10-07 ENCOUNTER — Encounter: Payer: Self-pay | Admitting: Family Medicine

## 2020-10-07 MED ORDER — AJOVY 225 MG/1.5ML ~~LOC~~ SOAJ: 225.0000 mg | SUBCUTANEOUS | 3 refills | Status: DC

## 2020-11-29 ENCOUNTER — Encounter (HOSPITAL_BASED_OUTPATIENT_CLINIC_OR_DEPARTMENT_OTHER): Payer: BC Managed Care – PPO | Admitting: Internal Medicine

## 2020-12-14 ENCOUNTER — Encounter (HOSPITAL_BASED_OUTPATIENT_CLINIC_OR_DEPARTMENT_OTHER): Payer: BC Managed Care – PPO | Admitting: Internal Medicine

## 2021-01-24 ENCOUNTER — Other Ambulatory Visit: Payer: Self-pay

## 2021-01-24 ENCOUNTER — Ambulatory Visit (HOSPITAL_BASED_OUTPATIENT_CLINIC_OR_DEPARTMENT_OTHER): Payer: BC Managed Care – PPO | Attending: Internal Medicine | Admitting: Internal Medicine

## 2021-01-24 DIAGNOSIS — G4750 Parasomnia, unspecified: Secondary | ICD-10-CM | POA: Insufficient documentation

## 2021-01-24 DIAGNOSIS — G4752 REM sleep behavior disorder: Secondary | ICD-10-CM

## 2021-01-24 DIAGNOSIS — F513 Sleepwalking [somnambulism]: Secondary | ICD-10-CM

## 2021-01-24 DIAGNOSIS — G478 Other sleep disorders: Secondary | ICD-10-CM

## 2021-01-24 DIAGNOSIS — F515 Nightmare disorder: Secondary | ICD-10-CM

## 2021-01-26 NOTE — Procedures (Signed)
NAME: [name] DATE OF BIRTH:  1981-01-24 MEDICAL RECORD NUMBER [id number]  LOCATION: [facility]  PHYSICIAN: Marius Ditch  DATE OF STUDY: 01/24/2021  SLEEP STUDY TYPE: Nocturnal Polysomnogram               REFERRING PHYSICIAN: Marius Ditch, MD  EPWORTH SLEEPINESS SCORE:  NA HEIGHT: 5\' 7"  (170.2 cm)  WEIGHT: 288 lb (130.6 kg)    Body mass index is 45.11 kg/m.  NECK SIZE: 17 in.  CLINICAL INFORMATION The patient was referred to the sleep center for evaluation of possible REM Sleep Behavior Disorder, sleepwalking, and nightmares.   MEDICATIONS Patient self administered medications include: N/A. Medications administered during study include No sleep medicine administered.Marland Kitchen  SLEEP STUDY TECHNIQUE A multi-channel overnight Polysomnography study was performed. The channels recorded and monitored were central and occipital EEG, electrooculogram (EOG), submentalis EMG (chin), nasal and oral airflow, thoracic and abdominal wall motion, anterior tibialis EMG, snore microphone, electrocardiogram, and a pulse oximetry.  TECHNICAL COMMENTS Comments added by Technician: None Comments added by Scorer: N/A  SLEEP ARCHITECTURE The study was initiated at 11:42:07 PM and terminated at 6:06:11 AM. The total recorded time was 384.1 minutes. EEG confirmed total sleep time was 327.5 minutes yielding a sleep efficiency of 85.3%%. Sleep onset after lights out was 36.4 minutes with a REM latency of 299.5 minutes. The patient spent 4.7%% of the night in stage N1 sleep, 78.3%% in stage N2 sleep, 6.9%% in stage N3 and 10.1% in REM. Wake after sleep onset (WASO) was 20.2 minutes. The Arousal Index was 5.3/hour.  RESPIRATORY PARAMETERS There were a total of 13 respiratory disturbances out of which 0 were apneas ( 0 obstructive, 0 mixed, 0 central) and 13 hypopneas. The apnea/hypopnea index (AHI) was 2.4 events/hour. The central sleep apnea index was 0 events/hour. The REM  AHI was 21.8 events/hour and NREM AHI was 0.2 events/hour. The supine AHI was 0.4 events/hour and the non supine AHI was 3.9 events/hour. Respiratory distrubance index was the same as the AHI.  Respiratory disturbances were associated with oxygen desaturation down to a nadir of 85.0% during sleep. The mean oxygen saturation during the study was 92.3%. The cumulative time under 88% oxygen saturation was 19.5 minutes. This hypoxemia was very mild and not associated with sleep disordered breathing. It was limited to Stage N3 sleep.   LEG MOVEMENT DATA The total leg movements were 0 with a resulting leg movement index of 0.0/hr . Associated arousal with leg movement index was 0.0/hr.  CARDIAC DATA The underlying cardiac rhythm was most consistent with sinus rhythm. Mean heart rate during sleep was 74.4 bpm. Additional rhythm abnormalities include None.  IMPRESSIONS - No Significant Obstructive Sleep apnea (OSA) overall. 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 If she had the usual percentage of REM sleep, then her overal AHI may be over 5/hr.  - REM parasomnias could be related to REM associated sleep disordered breathing, but the is no evidnce of REM sleep without chin atonia which is a diagnostic requirement to diagnosis REM Sleep Behavior Disorder.   Marius Ditch Sleep specialist, [place] [employer] of [employer] Medicine  ELECTRONICALLY SIGNED ON:  01/26/2021, 7:55 PM [place]: [phone]   FX: [phone] ACCREDITED  BY THE [employer]

## 2021-06-22 ENCOUNTER — Other Ambulatory Visit: Payer: Self-pay | Admitting: Family Medicine

## 2021-06-22 DIAGNOSIS — R55 Syncope and collapse: Secondary | ICD-10-CM

## 2021-06-22 DIAGNOSIS — D329 Benign neoplasm of meninges, unspecified: Secondary | ICD-10-CM

## 2021-06-24 ENCOUNTER — Other Ambulatory Visit: Payer: Self-pay

## 2021-06-24 ENCOUNTER — Ambulatory Visit
Admission: RE | Admit: 2021-06-24 | Discharge: 2021-06-24 | Disposition: A | Discharge status: Home / Self Care | Payer: BC Managed Care – PPO | Source: Ambulatory Visit | Attending: Family Medicine | Admitting: Family Medicine

## 2021-06-24 DIAGNOSIS — R55 Syncope and collapse: Secondary | ICD-10-CM

## 2021-06-24 DIAGNOSIS — D329 Benign neoplasm of meninges, unspecified: Secondary | ICD-10-CM

## 2021-06-24 MED ORDER — GADOBENATE DIMEGLUMINE 529 MG/ML IV SOLN
20.0000 mL | Freq: Once | INTRAVENOUS | Status: AC | PRN
  Administered 2021-06-24: 20 mL via INTRAVENOUS

## 2021-06-28 ENCOUNTER — Telehealth: Payer: Self-pay | Admitting: Neurology

## 2021-06-28 NOTE — Telephone Encounter (Signed)
Received call from [place] at [facility] @ [facility], calling on behalf of Dr. Mannie Stabile. Stated patient has had another episode of blacking out and Dr. Mannie Stabile would like her to be seen ASAP. I unfortunately can not find anywhere to work patient in sooner on Dr. Cathren Laine schedule.

## 2021-06-29 NOTE — Telephone Encounter (Signed)
I see the patient is now scheduled with Dr Jaynee Eagles on 07/04/21.

## 2021-06-30 ENCOUNTER — Encounter: Payer: Self-pay | Admitting: Cardiology

## 2021-06-30 ENCOUNTER — Other Ambulatory Visit: Payer: Self-pay

## 2021-06-30 ENCOUNTER — Ambulatory Visit: Payer: BC Managed Care – PPO | Admitting: Cardiology

## 2021-06-30 VITALS — BP 120/84 | HR 77 | Temp 97.9°F | Resp 17 | Ht 67.0 in | Wt 289.0 lb

## 2021-06-30 DIAGNOSIS — E669 Obesity, unspecified: Secondary | ICD-10-CM

## 2021-06-30 DIAGNOSIS — R55 Syncope and collapse: Secondary | ICD-10-CM

## 2021-06-30 MED ORDER — VITAMIN B-6 100 MG PO TABS: 100.0000 mg | ORAL_TABLET | Freq: Every day | ORAL | 0 refills | Status: DC

## 2021-06-30 MED ORDER — B-12 100 MCG PO TABS: 2000.0000 ug | ORAL_TABLET | Freq: Every day | ORAL | 0 refills | Status: DC

## 2021-06-30 MED ORDER — THIAMINE HCL 100 MG PO TABS: 100.0000 mg | ORAL_TABLET | Freq: Every day | ORAL | 0 refills | Status: DC
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
## 2021-07-04 ENCOUNTER — Encounter: Payer: Self-pay | Admitting: Neurology

## 2021-07-04 ENCOUNTER — Ambulatory Visit: Payer: BC Managed Care – PPO | Admitting: Neurology

## 2021-07-04 ENCOUNTER — Other Ambulatory Visit: Payer: Self-pay

## 2021-07-04 VITALS — BP 116/78 | HR 61 | Ht 67.0 in | Wt 300.0 lb

## 2021-07-04 DIAGNOSIS — R55 Syncope and collapse: Secondary | ICD-10-CM | POA: Diagnosis not present

## 2021-07-04 DIAGNOSIS — G43709 Chronic migraine without aura, not intractable, without status migrainosus: Secondary | ICD-10-CM | POA: Diagnosis not present

## 2021-07-04 DIAGNOSIS — G43009 Migraine without aura, not intractable, without status migrainosus: Secondary | ICD-10-CM | POA: Diagnosis not present

## 2021-07-04 MED ORDER — AJOVY 225 MG/1.5ML ~~LOC~~ SOAJ: 225.0000 mg | SUBCUTANEOUS | 3 refills | Status: DC

## 2021-07-04 MED ORDER — NURTEC 75 MG PO TBDP: 75.0000 mg | ORAL_TABLET | Freq: Every day | ORAL | 11 refills | Status: DC | PRN
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
## 2021-07-04 NOTE — Patient Instructions (Signed)
EEG (90 minutes) then a prolong (24-72 hours or longer)  Syncope  Syncope refers to a condition in which a person temporarily loses consciousness. Syncope may also be called fainting or passing out. It is caused by a sudden decrease in blood flow to the brain. Even though most causes of syncope are not dangerous, syncope can be a sign of a serious medical problem. Your health care provider may do tests to find the reason why you are havingsyncope. Signs that you may be about to faint include: Feeling dizzy or light-headed. Feeling nauseous. Seeing all white or all black in your field of vision. Having cold, clammy skin. If you faint, get medical help right away. Call your local emergency services (911 in the [place] Do not drive yourself to the hospital. Follow these instructions at home: Pay attention to any changes in your symptoms. Take these actions to stay safeand to help relieve your symptoms: Lifestyle Do not drive, use machinery, or play sports until your health care provider says it is okay. Do not drink alcohol. Do not use any products that contain nicotine or tobacco, such as cigarettes and e-cigarettes. If you need help quitting, ask your health care provider. Drink enough fluid to keep your urine pale yellow. General instructions Take over-the-counter and prescription medicines only as told by your health care provider. If you are taking blood pressure or heart medicine, get up slowly and take several minutes to sit and then stand. This can reduce dizziness or light-headedness. Have someone stay with you until you feel stable. If you start to feel like you might faint, lie down right away and raise (elevate) your feet above the level of your heart. Breathe deeply and steadily. Wait until all the symptoms have passed. Keep all follow-up visits as told by your health care provider. This is important. Get help right away if you: Have a severe headache. Faint once or  repeatedly. Have pain in your chest, abdomen, or back. Have a very fast or irregular heartbeat (palpitations). Have pain when you breathe. Are bleeding from your mouth or rectum, or you have black or tarry stool. Have a seizure. Are confused. Have trouble walking. Have severe weakness. Have vision problems. These symptoms may represent a serious problem that is an emergency. Do not wait to see if your symptoms will go away. Get medical help right away. Call your local emergency services (911 in the [place] Do not drive yourself to the hospital. Summary Syncope refers to a condition in which a person temporarily loses consciousness. It is caused by a sudden decrease in blood flow to the brain. Signs that you may be about to faint include dizziness, feeling light-headed, feeling nauseous, sudden vision changes, or cold, clammy skin. Although most causes of syncope are not dangerous, syncope can be a sign of a serious medical problem. If you faint, get medical help right away. This information is not intended to replace advice given to you by your health care provider. Make sure you discuss any questions you have with your healthcare provider. Document Revised: 02/17/2020 Document Reviewed: 03/18/2020 Elsevier Patient Education  [place].

## 2021-07-05 ENCOUNTER — Telehealth: Payer: Self-pay | Admitting: Neurology

## 2021-07-05 NOTE — Telephone Encounter (Signed)
EEG order sent to [facility], they will reach out to pt to schedule.

## 2021-07-06 ENCOUNTER — Ambulatory Visit: Payer: BC Managed Care – PPO

## 2021-07-06 ENCOUNTER — Other Ambulatory Visit: Payer: Self-pay

## 2021-07-06 DIAGNOSIS — R55 Syncope and collapse: Secondary | ICD-10-CM

## 2021-07-13 ENCOUNTER — Ambulatory Visit (INDEPENDENT_AMBULATORY_CARE_PROVIDER_SITE_OTHER): Payer: BC Managed Care – PPO | Admitting: Neurology

## 2021-07-13 ENCOUNTER — Other Ambulatory Visit: Payer: Self-pay

## 2021-07-13 DIAGNOSIS — R55 Syncope and collapse: Secondary | ICD-10-CM | POA: Diagnosis not present

## 2021-07-13 NOTE — Progress Notes (Signed)
EEG completed at [facility]. FUll report to follow by Dr. Loni Muse. [name]

## 2021-07-13 NOTE — Procedures (Signed)
   HISTORY: 40 years old presented with passing out.  TECHNIQUE:  This is a routine 16 channel EEG recording with one channel devoted to a limited EKG recording.  It was performed during wakefulness, drowsiness and asleep.  Hyperventilation and photic stimulation were performed as activating procedures.  There are minimum muscle and movement artifact noted.  Upon maximum arousal, posterior dominant waking rhythm consistent of rhythmic alpha range activity, with frequency of 11 Hz. Activities are symmetric over the bilateral posterior derivations and attenuated with eye opening.  Hyperventilation produced mild/moderate buildup with higher amplitude and the slower activities noted.  Photic stimulation did not alter the tracing.  During EEG recording, patient developed drowsiness and no deep stage of sleep was achieved.  During EEG recording, there was no epileptiform discharge noted.  EKG demonstrate sinus rhythm, with heart rate of 68 bpm.  CONCLUSION: This is a  normal awake EEG.  There is no electrodiagnostic evidence of epileptiform discharge.  Marcial Pacas, M.D. Ph.D.  [facility] [place], [place] [address] Phone: [phone] Fax:      [phone]

## 2021-07-18 NOTE — Telephone Encounter (Signed)
Yes, we are using [facility] and I will be reading the studies. I can forward you with the order form.

## 2021-07-19 NOTE — Telephone Encounter (Signed)
I have reached out to the rep for [facility] to obtain an order form for EEG.

## 2021-07-19 NOTE — Progress Notes (Signed)
See phone note

## 2021-07-19 NOTE — Telephone Encounter (Signed)
I have completed the ambulatory EEG order form for [facility] and it is pending MD signature. Fax number: [phone]

## 2021-07-26 ENCOUNTER — Telehealth: Payer: Self-pay

## 2021-07-26 NOTE — Telephone Encounter (Signed)
Patient is scheduled for her In-Home Video EEG from 9/20 - 08/11/21, per her request. This is through [facility].

## 2021-07-26 NOTE — Telephone Encounter (Signed)
Great, thanks

## 2021-08-09 DIAGNOSIS — R55 Syncope and collapse: Secondary | ICD-10-CM | POA: Diagnosis not present

## 2021-08-10 DIAGNOSIS — R55 Syncope and collapse: Secondary | ICD-10-CM | POA: Diagnosis not present

## 2021-08-17 ENCOUNTER — Encounter: Payer: Self-pay | Admitting: Neurology

## 2021-08-17 ENCOUNTER — Telehealth: Payer: Self-pay | Admitting: *Deleted

## 2021-08-17 NOTE — Telephone Encounter (Signed)
Pt calling at her pcp Dr. Mannie Stabile would like results faxed once read.  I relayed that we have not received yet, but I will check on this and then let them know once our provider has reviewed.  Appreciated call back. Note Patient is scheduled for her In-Home Video EEG from 9/20 - 08/11/21, per her request. This is through [facility].

## 2021-08-17 NOTE — Telephone Encounter (Signed)
I read it yesterday and it is normal. We can fax it now

## 2021-08-17 NOTE — Telephone Encounter (Signed)
I called pt and relayed her normal VEEG results.  She verbalized understanding and I faxed with confirmation to Dr.Hagler per pcp request.

## 2021-08-17 NOTE — Telephone Encounter (Signed)
Received report findings from Dr. April Manson. Once reviewed by Dr. Jaynee Eagles will fax and call pt with results.

## 2021-08-17 NOTE — Progress Notes (Signed)
48-hour EEG completed and it is normal.  There is no focal slowing, there are no epileptiform discharges.  A normal EEG does not exclude a diagnosis of epilepsy.   Alric Ran, MD

## 2021-08-19 DIAGNOSIS — R55 Syncope and collapse: Secondary | ICD-10-CM

## 2021-08-28 ENCOUNTER — Other Ambulatory Visit: Payer: Self-pay | Admitting: Neurology

## 2021-08-28 ENCOUNTER — Encounter: Payer: Self-pay | Admitting: Neurology

## 2021-08-28 DIAGNOSIS — R55 Syncope and collapse: Secondary | ICD-10-CM

## 2021-08-28 NOTE — Procedures (Signed)
   Description 48 hrs ambulatory video EEG    TECHNICAL DESCRIPTION: This AVEEG was performed using equipment provided by [employer] utilizing Bluetooth (Trackit ) amplifiers with continuous EEGT attended video collection using encrypted remote transmission via [place] secured cellular tower network with data rates for each AVEEG performed. This is a Biomedical engineer AVEEG, obtained, according to the 10-20 international electrode placement system, reformatted digitally into referential and bipolar montages. Data was acquired with a minimum of 21 bipolar connections and sampled at a minimum rate of 250 cycles per second per channel, maximum rate of 450 cycles per second per channel and two channels for EKG. The entire AVEEG study was recorded through cable and or radio telemetry for subsequent analysis. Specified epochs of the AVEEG data were identified at the direction of the subject by the depression of a push button by the patient. Each patient's event file included data acquired two minutes prior to the push button activation and continuing until two minutes afterwards. AVEEG files were reviewed on [employer] Platform by [employer] with a digital high frequency filter set at 70 Hz and a low frequency filter set at 0.1 Hz with a paper speed of 56mm/s resulting in 10 seconds per digital page. This entire AVEEG was reviewed by the EEG Technologist. Random time samples, random sleep samples, clips, patient initiated push button files with included patient daily diary logs, EEG Technologist bookmarked note data was reviewed and verified for accuracy and validity by the governing reading neurologist in full details. This AEEGV was fully compliant with all requirements for CPT 97500 for setup, patient education, take down and administered by an EEG technologist.   INTERMITTENT MONITORING WITH VIDEO TECHNICAL DESCRIPTION:  This Long-Term AVEEG was monitored intermittently by a qualified EEG technologist for the  entirety of the recording; quality check-ins were performed at a minimum of every two hours, checking and documenting real-time data and video to assure the integrity and quality of the recording (e.g., camera position, electrode integrity and impedance), and identify the need for maintenance. For intermittent monitoring, an EEG Technologist monitored no more than 12 patients concurrently. Diagnostic video was captured at least 80% of the time during the recording.   TECHNOLOGIST EVENTS: No clear epileptiform activities were detected by the reviewing neurodiagnostic technologist during the recording for further evaluation.  PATIENT EVENTS:  The patient pushed the event button 11 times during the AVEEG recording for further evaluation. However, EEGT test press #2 times with pt at the beginning of the tracing.  TIME SAMPLES: 1 minute of every hour recorded are reviewed as random time samples.   SLEEP SAMPLES: 5 minutes of every 24 hour recorded sleep cycle are reviewed as random sleep samples.   BACKGROUND EEG: This AVEEG consists of well-modulated bilateral synchronous and symmetrical background in the alpha frequencies in the awake state.  AWAKE: The posterior dominant rhythm was characterized by symmetric and reactive 9-10Hz  activity with eyes closed.   SLEEP: Stage two sleep displayed Sleep Spindles and Vertex Waveform components. All other sleep stages appeared symmetrical and synchronous with regards to K-Complexes, and REM.  EKG: Normal sinus rhythm.   AVEEG Interpretations: This is a normal 48 hours video ambulatory EEG. A normal EEG does not exclude a diagnosis of epilepsy.   Alric Ran, MD [facility]

## 2021-08-31 NOTE — Telephone Encounter (Signed)
Referral has been sent to [facility]/Epilepsy clinic. Phone: [phone].

## 2021-09-07 ENCOUNTER — Ambulatory Visit: Payer: BC Managed Care – PPO | Admitting: Internal Medicine

## 2021-09-28 ENCOUNTER — Institutional Professional Consult (permissible substitution): Payer: BC Managed Care – PPO | Admitting: Neurology

## 2021-12-21 ENCOUNTER — Encounter: Payer: Self-pay | Admitting: Neurology

## 2021-12-21 DIAGNOSIS — R55 Syncope and collapse: Secondary | ICD-10-CM

## 2021-12-21 HISTORY — DX: Syncope and collapse: R55

## 2022-01-18 ENCOUNTER — Encounter: Payer: Self-pay | Admitting: Neurology

## 2022-01-18 ENCOUNTER — Encounter: Payer: Self-pay | Admitting: Cardiology

## 2022-01-18 ENCOUNTER — Other Ambulatory Visit: Payer: Self-pay

## 2022-01-18 ENCOUNTER — Ambulatory Visit: Payer: BC Managed Care – PPO | Admitting: Cardiology

## 2022-01-18 ENCOUNTER — Other Ambulatory Visit: Payer: Self-pay | Admitting: Neurology

## 2022-01-18 VITALS — BP 135/86 | HR 69 | Temp 98.0°F | Resp 12 | Ht 67.0 in | Wt 300.2 lb

## 2022-01-18 DIAGNOSIS — R55 Syncope and collapse: Secondary | ICD-10-CM

## 2022-01-18 DIAGNOSIS — Z6841 Body Mass Index (BMI) 40.0 and over, adult: Secondary | ICD-10-CM

## 2022-01-18 DIAGNOSIS — E66813 Obesity, class 3: Secondary | ICD-10-CM

## 2022-01-18 DIAGNOSIS — G43009 Migraine without aura, not intractable, without status migrainosus: Secondary | ICD-10-CM

## 2022-01-18 MED ORDER — AJOVY 225 MG/1.5ML ~~LOC~~ SOAJ: 225.0000 mg | SUBCUTANEOUS | 3 refills | Status: DC

## 2022-01-18 MED ORDER — ONDANSETRON 4 MG PO TBDP: 4.0000 mg | ORAL_TABLET | Freq: Three times a day (TID) | ORAL | 3 refills | Status: DC | PRN

## 2022-01-18 MED ORDER — NURTEC 75 MG PO TBDP: 75.0000 mg | ORAL_TABLET | Freq: Every day | ORAL | 11 refills | Status: DC | PRN

## 2022-01-18 MED ORDER — PROPRANOLOL HCL 20 MG PO TABS: 20.0000 mg | ORAL_TABLET | Freq: Three times a day (TID) | ORAL | 1 refills | Status: DC
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
## 2022-01-25 ENCOUNTER — Encounter: Payer: Self-pay | Admitting: Cardiology

## 2022-01-25 NOTE — Telephone Encounter (Signed)
From patient.

## 2022-02-09 ENCOUNTER — Encounter: Payer: Self-pay | Admitting: Obstetrics & Gynecology

## 2022-02-09 ENCOUNTER — Other Ambulatory Visit: Payer: Self-pay

## 2022-02-09 ENCOUNTER — Other Ambulatory Visit (HOSPITAL_COMMUNITY)
Admission: RE | Admit: 2022-02-09 | Discharge: 2022-02-09 | Disposition: A | Discharge status: Home / Self Care | Payer: BC Managed Care – PPO | Source: Ambulatory Visit | Attending: Obstetrics & Gynecology | Admitting: Obstetrics & Gynecology

## 2022-02-09 ENCOUNTER — Ambulatory Visit: Payer: BC Managed Care – PPO | Admitting: Obstetrics & Gynecology

## 2022-02-09 VITALS — BP 121/71 | HR 79 | Ht 67.0 in | Wt 291.4 lb

## 2022-02-09 DIAGNOSIS — Z01411 Encounter for gynecological examination (general) (routine) with abnormal findings: Secondary | ICD-10-CM | POA: Diagnosis not present

## 2022-02-09 DIAGNOSIS — N939 Abnormal uterine and vaginal bleeding, unspecified: Secondary | ICD-10-CM | POA: Diagnosis not present

## 2022-02-09 DIAGNOSIS — Z9889 Other specified postprocedural states: Secondary | ICD-10-CM

## 2022-02-09 DIAGNOSIS — Z01419 Encounter for gynecological examination (general) (routine) without abnormal findings: Secondary | ICD-10-CM | POA: Diagnosis present

## 2022-02-09 DIAGNOSIS — Z789 Other specified health status: Secondary | ICD-10-CM

## 2022-02-09 DIAGNOSIS — Z6841 Body Mass Index (BMI) 40.0 and over, adult: Secondary | ICD-10-CM | POA: Diagnosis not present

## 2022-02-09 NOTE — Progress Notes (Signed)
? ?WELL-WOMAN EXAMINATION ?Patient name: [name] MRN [id number]  Date of birth: 06-Jul-1981 ?Chief Complaint:   ?New Patient (Initial Visit) and Menstrual Problem ? ?History of Present Illness:   ?[name] is a 41 y.o. G0P0000 transgender male being seen today for a routine well-woman exam.  ? ?Pt being followed at [facility] at [facility].   ? ?Menses are irregular- sometimes super light and gone.  Other times, super heavy and last for several days.  Typically the heavy period is every other month.  Denies intermenstrual bleeding.  Some dysmenorrhea- improvement with ibuprofen.  At this point, desires a more permanent option. ? ?Prior [facility] patient- records were reviewed.  S/p ablation completed June 2019.  Initially bleeding had improved; however, over the past year or so she has noted return of her menses.  Prior EMB negative (2019). ?Prior US- 03/2018- 6.5cm anteverted uterus, no abnormalities noted. Normal ovaries bilaterally ? ?Patient's last menstrual period was 01/28/2022 (approximate). ? ?Last pap 2019.  ?Last mammogram: up to date per patient- [facility] ?Last colonoscopy: n/a ? ? ?  02/09/2022  ? 10:09 AM 11/17/2014  ? 11:20 AM  ?Depression screen PHQ 2/9  ?Decreased Interest 0 0  ?Down, Depressed, Hopeless 0 0  ?PHQ - 2 Score 0 0  ?Altered sleeping 1   ?Tired, decreased energy 1   ?Change in appetite 0   ?Feeling bad or failure about yourself  0   ?Trouble concentrating 1   ?Moving slowly or fidgety/restless 0   ?Suicidal thoughts 0   ?PHQ-9 Score 3   ? ? ?Review of Systems:   ?Pertinent items are noted in HPI ?Denies any headaches, blurred vision, fatigue, shortness of breath, chest pain, bowel movements, urination unless otherwise stated above. ? ?Pertinent History Reviewed:  ?Reviewed past medical,surgical, social and family history.  ?Reviewed problem list, medications and allergies. ?Physical Assessment:  ? ?Vitals:  ? 02/09/22 1006  ?BP: 121/71  ?Pulse: 79  ?Weight: 291 lb 6.4 oz (132.2 kg)   ?Height: '5\' 7"'$  (1.702 m)  ?Body mass index is 45.64 kg/m?. ?  ?     Physical Examination:  ? General appearance - well appearing, and in no distress ? Mental status - alert, oriented to person, place, and time ? Psych:  She has a normal mood and affect ? Skin - warm and dry, normal color, no suspicious lesions noted ? Chest - effort normal, all lung fields clear to auscultation bilaterally ? Heart - normal rate and regular rhythm ? Neck:  midline trachea, no thyromegaly or nodules ? Breasts - declined ? Abdomen - obese, soft, nontender, nondistended, no masses or organomegaly ? Pelvic - VULVA: normal appearing vulva with no masses, tenderness or lesions  VAGINA: normal appearing vagina with normal color and discharge, no lesions  CERVIX: normal appearing cervix without discharge or lesions, no CMT ? Thin prep pap is done with HR HPV cotesting ? UTERUS: uterus is felt to be normal size, shape, consistency and nontender  ? ADNEXA: No adnexal masses or tenderness noted. ? Extremities:  No swelling or varicosities noted ? ?Chaperone:  pt declined    ? ? ?Assessment & Plan:  ?1) Well-Woman Exam ?-pap collected ?-mammogram up to date ? ?2) AUB ?-reviewed management options, due to prior ablation wishes to proceed with permanent intervention ?-plan for hysterectomy.   ?-due to body habitus with minimal descent of uterus plan for mini-lap hysterectomy ?-Reviewed risk/benefits including risk of bleeding, infection and injury to surrounding organs. ?-discussed ovarian  preservation due to all cause mortality, menopausal symptoms and cardiac benefits ?-Discussed recovery and same day surgery ?-Questions and concerns were addressed and wishes to proceed anytime after mid-May ? ? ?Meds: No orders of the defined types were placed in this encounter. ? ? ?Follow-up: Return for TBD. ? ? ?Janyth Pupa, DO ?Attending [place] ?[facility] [place] ? ? ?

## 2022-02-13 LAB — CYTOLOGY - PAP
Adequacy: ABSENT
Comment: NEGATIVE
Diagnosis: NEGATIVE
High risk HPV: NEGATIVE

## 2022-04-12 ENCOUNTER — Encounter: Payer: Self-pay | Admitting: Obstetrics & Gynecology

## 2022-04-13 ENCOUNTER — Telehealth (INDEPENDENT_AMBULATORY_CARE_PROVIDER_SITE_OTHER): Payer: BC Managed Care – PPO | Admitting: Obstetrics & Gynecology

## 2022-04-13 ENCOUNTER — Encounter: Payer: Self-pay | Admitting: Obstetrics & Gynecology

## 2022-04-13 DIAGNOSIS — Z9889 Other specified postprocedural states: Secondary | ICD-10-CM

## 2022-04-13 DIAGNOSIS — Z6841 Body Mass Index (BMI) 40.0 and over, adult: Secondary | ICD-10-CM

## 2022-04-13 DIAGNOSIS — N939 Abnormal uterine and vaginal bleeding, unspecified: Secondary | ICD-10-CM
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
## 2022-04-19 ENCOUNTER — Ambulatory Visit: Payer: BC Managed Care – PPO | Admitting: Cardiology

## 2022-04-19 ENCOUNTER — Encounter: Payer: Self-pay | Admitting: Cardiology

## 2022-04-19 VITALS — BP 124/78 | HR 86 | Temp 98.0°F | Resp 16 | Ht 67.0 in | Wt 292.0 lb

## 2022-04-19 DIAGNOSIS — R55 Syncope and collapse: Secondary | ICD-10-CM

## 2022-04-19 MED ORDER — PROPRANOLOL HCL 20 MG PO TABS: 20.0000 mg | ORAL_TABLET | Freq: Three times a day (TID) | ORAL | 1 refills | Status: DC
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
## 2022-04-20 ENCOUNTER — Encounter: Payer: Self-pay | Admitting: Obstetrics & Gynecology
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
## 2022-04-27 NOTE — Patient Instructions (Signed)
[name]  04/27/2022     '@PREFPERIOPPHARMACY'$ @   Your procedure is scheduled on  05/02/2022.   Report to [facility] at  0600 A.M.   Call this number if you have problems the morning of surgery:  [phone]   Remember:  Do not eat or drink after midnight.      Use your nebulizer before you come and bring your rescue inhaler with you.      Take these medicines the morning of surgery with A SIP OF WATER        flexeril or mobic(if needed), pepcid, hydroxyzine, antivert(if needed), zofran (I needed),propranolol, nurtec(if needed), effexor.     Do not wear jewelry, make-up or nail polish.  Do not wear lotions, powders, or perfumes, or deodorant.  Do not shave 48 hours prior to surgery.  Men may shave face and neck.  Do not bring valuables to the hospital.  [facility] is not responsible for any belongings or valuables.  Contacts, dentures or bridgework may not be worn into surgery.  Leave your suitcase in the car.  After surgery it may be brought to your room.  For patients admitted to the hospital, discharge time will be determined by your treatment team.  Patients discharged the day of surgery will not be allowed to drive home and must have someone with them for 24 hours.    Special instructions:   DO NOT smoke tobacco or vape for 24 hours before your procedure.  Please read over the following fact sheets that you were given. Pain Booklet, Coughing and Deep Breathing, Blood Transfusion Information, Surgical Site Infection Prevention, Anesthesia Post-op Instructions, and Care and Recovery After Surgery      Abdominal Hysterectomy, Care After The following information offers guidance on how to care for yourself after your procedure. Your doctor may also give you more specific instructions. If you have problems or questions, contact your doctor. What can I expect after the procedure? After the procedure, it is common to have: Pain. Tiredness. No desire  to eat. Less interest in sex. Bleeding and fluid (discharge) from your vagina. You may need to use a pad after this procedure. Trouble pooping (constipation). Feelings of sadness or other emotions. Follow these instructions at home: Medicines Take over-the-counter and prescription medicines only as told by your doctor. If you were prescribed an antibiotic medicine, take it as told by your doctor. Do not stop using the antibiotic even if you start to feel better. If told, take steps to prevent problems with pooping (constipation). You may need to: Drink enough fluid to keep your pee (urine) pale yellow. Take medicines. You will be told what medicines to take. Eat foods that are high in fiber. These include beans, whole grains, and fresh fruits and vegetables. Limit foods that are high in fat and sugar. These include fried or sweet foods. Ask your doctor if you should avoid driving or using machines while you are taking your medicine. Surgical cut care  Follow instructions from your doctor about how to take care of your cut from surgery (incision). Make sure you: Wash your hands with soap and water for at least 20 seconds before and after you change your bandage. If you cannot use soap and water, use hand sanitizer. Change your bandage. Leave stitches or skin glue in place for at least two weeks. Leave tape strips alone unless you are told to take them off. You may trim the edges  of the tape strips if they curl up. Keep the bandage dry until your doctor says it can be taken off. Check your incision every day for signs of infection. Check for: More redness, swelling, or pain. Fluid or blood. Warmth. Pus or a bad smell. Activity  Rest as told by your doctor. Get up to take short walks every 1 to 2 hours. Ask for help if you feel weak or unsteady. Do not lift anything that is heavier than 10 lb (4.5 kg), or the limit that you are told. Follow your doctor's advice about exercise, driving,  and general activities. Return to your normal activities when your doctor says that it is safe. Lifestyle Do not douche, use tampons, or have sex for at least 6 weeks or as told by your doctor. Do not drink alcohol until your doctor says it is okay. Do not smoke or use any products that contain nicotine or tobacco. These can delay healing after surgery. If you need help quitting, ask your doctor. General instructions Do not take baths, swim, or use a hot tub. Ask your doctor about taking showers or sponge baths. Try to have a responsible adult at home with you for the first 1-2 weeks to help with your daily chores. Wear tight-fitting (compression) stockings as told by your doctor. Keep all follow-up visits. Contact a doctor if: You have chills or a fever. You have any of these signs of infection around your cut: More redness, swelling, or pain. Fluid or blood. Warmth. Pus or a bad smell. Your cut breaks open. You feel dizzy or light-headed. You have pain or bleeding when you pee. You keep having watery poop (diarrhea). You keep feeling like you may vomit or you keep vomiting. You have fluid coming from your vagina that is not normal. You have any type of reaction to your medicine that is not normal, like a rash, or you develop an allergy to your medicine. Your pain medicine does not help. Get help right away if: You have a fever and your symptoms get worse suddenly. You have very bad pain in your belly (abdomen). You are short of breath. You faint. You have pain, swelling, or redness of your leg. You bleed a lot from your vagina and you see blood clots. Summary It is normal to have some pain, tiredness, and fluid that comes from your vagina. Do not take baths, swim, or use a hot tub. Ask your doctor about taking showers or sponge baths. Do not lift anything that is heavier than 10 lb (4.5 kg), or the limit that you are told. Follow your doctor's advice about exercise, driving,  and general activities. Try to have a responsible adult at home with you for the first 1-2 weeks to help with your daily chores. This information is not intended to replace advice given to you by your health care provider. Make sure you discuss any questions you have with your health care provider. Document Revised: 01/14/2021 Document Reviewed: 07/08/2020 Elsevier Patient Education  [place] Anesthesia, Adult, Care After This sheet gives you information about how to care for yourself after your procedure. Your health care provider may also give you more specific instructions. If you have problems or questions, contact your health care provider. What can I expect after the procedure? After the procedure, the following side effects are common: Pain or discomfort at the IV site. Nausea. Vomiting. Sore throat. Trouble concentrating. Feeling cold or chills. Feeling weak or tired. Sleepiness and fatigue.  Soreness and body aches. These side effects can affect parts of the body that were not involved in surgery. Follow these instructions at home: For the time period you were told by your health care provider:  Rest. Do not participate in activities where you could fall or become injured. Do not drive or use machinery. Do not drink alcohol. Do not take sleeping pills or medicines that cause drowsiness. Do not make important decisions or sign legal documents. Do not take care of children on your own. Eating and drinking Follow any instructions from your health care provider about eating or drinking restrictions. When you feel hungry, start by eating small amounts of foods that are soft and easy to digest (bland), such as toast. Gradually return to your regular diet. Drink enough fluid to keep your urine pale yellow. If you vomit, rehydrate by drinking water, juice, or clear broth. General instructions If you have sleep apnea, surgery and certain medicines can increase your  risk for breathing problems. Follow instructions from your health care provider about wearing your sleep device: Anytime you are sleeping, including during daytime naps. While taking prescription pain medicines, sleeping medicines, or medicines that make you drowsy. Have a responsible adult stay with you for the time you are told. It is important to have someone help care for you until you are awake and alert. Return to your normal activities as told by your health care provider. Ask your health care provider what activities are safe for you. Take over-the-counter and prescription medicines only as told by your health care provider. If you smoke, do not smoke without supervision. Keep all follow-up visits as told by your health care provider. This is important. Contact a health care provider if: You have nausea or vomiting that does not get better with medicine. You cannot eat or drink without vomiting. You have pain that does not get better with medicine. You are unable to pass urine. You develop a skin rash. You have a fever. You have redness around your IV site that gets worse. Get help right away if: You have difficulty breathing. You have chest pain. You have blood in your urine or stool, or you vomit blood. Summary After the procedure, it is common to have a sore throat or nausea. It is also common to feel tired. Have a responsible adult stay with you for the time you are told. It is important to have someone help care for you until you are awake and alert. When you feel hungry, start by eating small amounts of foods that are soft and easy to digest (bland), such as toast. Gradually return to your regular diet. Drink enough fluid to keep your urine pale yellow. Return to your normal activities as told by your health care provider. Ask your health care provider what activities are safe for you. This information is not intended to replace advice given to you by your health care  provider. Make sure you discuss any questions you have with your health care provider. Document Revised: 07/22/2020 Document Reviewed: 02/19/2020 Elsevier Patient Education  [place]. How to Use Chlorhexidine for Bathing Chlorhexidine gluconate (CHG) is a germ-killing (antiseptic) solution that is used to clean the skin. It can get rid of the bacteria that normally live on the skin and can keep them away for about 24 hours. To clean your skin with CHG, you may be given: A CHG solution to use in the shower or as part of a sponge bath. A prepackaged cloth  that contains CHG. Cleaning your skin with CHG may help lower the risk for infection: While you are staying in the intensive care unit of the hospital. If you have a vascular access, such as a central line, to provide short-term or long-term access to your veins. If you have a catheter to drain urine from your bladder. If you are on a ventilator. A ventilator is a machine that helps you breathe by moving air in and out of your lungs. After surgery. What are the risks? Risks of using CHG include: A skin reaction. Hearing loss, if CHG gets in your ears and you have a perforated eardrum. Eye injury, if CHG gets in your eyes and is not rinsed out. The CHG product catching fire. Make sure that you avoid smoking and flames after applying CHG to your skin. Do not use CHG: If you have a chlorhexidine allergy or have previously reacted to chlorhexidine. On babies younger than 22 months of age. How to use CHG solution Use CHG only as told by your health care provider, and follow the instructions on the label. Use the full amount of CHG as directed. Usually, this is one bottle. During a shower Follow these steps when using CHG solution during a shower (unless your health care provider gives you different instructions): Start the shower. Use your normal soap and shampoo to wash your face and hair. Turn off the shower or move out of the  shower stream. Pour the CHG onto a clean washcloth. Do not use any type of brush or rough-edged sponge. Starting at your neck, lather your body down to your toes. Make sure you follow these instructions: If you will be having surgery, pay special attention to the part of your body where you will be having surgery. Scrub this area for at least 1 minute. Do not use CHG on your head or face. If the solution gets into your ears or eyes, rinse them well with water. Avoid your genital area. Avoid any areas of skin that have broken skin, cuts, or scrapes. Scrub your back and under your arms. Make sure to wash skin folds. Let the lather sit on your skin for 1-2 minutes or as long as told by your health care provider. Thoroughly rinse your entire body in the shower. Make sure that all body creases and crevices are rinsed well. Dry off with a clean towel. Do not put any substances on your body afterward--such as powder, lotion, or perfume--unless you are told to do so by your health care provider. Only use lotions that are recommended by the manufacturer. Put on clean clothes or pajamas. If it is the night before your surgery, sleep in clean sheets.  During a sponge bath Follow these steps when using CHG solution during a sponge bath (unless your health care provider gives you different instructions): Use your normal soap and shampoo to wash your face and hair. Pour the CHG onto a clean washcloth. Starting at your neck, lather your body down to your toes. Make sure you follow these instructions: If you will be having surgery, pay special attention to the part of your body where you will be having surgery. Scrub this area for at least 1 minute. Do not use CHG on your head or face. If the solution gets into your ears or eyes, rinse them well with water. Avoid your genital area. Avoid any areas of skin that have broken skin, cuts, or scrapes. Scrub your back and under your arms. Make sure  to wash skin  folds. Let the lather sit on your skin for 1-2 minutes or as long as told by your health care provider. Using a different clean, wet washcloth, thoroughly rinse your entire body. Make sure that all body creases and crevices are rinsed well. Dry off with a clean towel. Do not put any substances on your body afterward--such as powder, lotion, or perfume--unless you are told to do so by your health care provider. Only use lotions that are recommended by the manufacturer. Put on clean clothes or pajamas. If it is the night before your surgery, sleep in clean sheets. How to use CHG prepackaged cloths Only use CHG cloths as told by your health care provider, and follow the instructions on the label. Use the CHG cloth on clean, dry skin. Do not use the CHG cloth on your head or face unless your health care provider tells you to. When washing with the CHG cloth: Avoid your genital area. Avoid any areas of skin that have broken skin, cuts, or scrapes. Before surgery Follow these steps when using a CHG cloth to clean before surgery (unless your health care provider gives you different instructions): Using the CHG cloth, vigorously scrub the part of your body where you will be having surgery. Scrub using a back-and-forth motion for 3 minutes. The area on your body should be completely wet with CHG when you are done scrubbing. Do not rinse. Discard the cloth and let the area air-dry. Do not put any substances on the area afterward, such as powder, lotion, or perfume. Put on clean clothes or pajamas. If it is the night before your surgery, sleep in clean sheets.  For general bathing Follow these steps when using CHG cloths for general bathing (unless your health care provider gives you different instructions). Use a separate CHG cloth for each area of your body. Make sure you wash between any folds of skin and between your fingers and toes. Wash your body in the following order, switching to a new cloth  after each step: The front of your neck, shoulders, and chest. Both of your arms, under your arms, and your hands. Your stomach and groin area, avoiding the genitals. Your right leg and foot. Your left leg and foot. The back of your neck, your back, and your buttocks. Do not rinse. Discard the cloth and let the area air-dry. Do not put any substances on your body afterward--such as powder, lotion, or perfume--unless you are told to do so by your health care provider. Only use lotions that are recommended by the manufacturer. Put on clean clothes or pajamas. Contact a health care provider if: Your skin gets irritated after scrubbing. You have questions about using your solution or cloth. You swallow any chlorhexidine. Call your local poison control center (1-312-277-6799 in the [place] Get help right away if: Your eyes itch badly, or they become very red or swollen. Your skin itches badly and is red or swollen. Your hearing changes. You have trouble seeing. You have swelling or tingling in your mouth or throat. You have trouble breathing. These symptoms may represent a serious problem that is an emergency. Do not wait to see if the symptoms will go away. Get medical help right away. Call your local emergency services (911 in the [place] Do not drive yourself to the hospital. Summary Chlorhexidine gluconate (CHG) is a germ-killing (antiseptic) solution that is used to clean the skin. Cleaning your skin with CHG may help to lower your risk for  infection. You may be given CHG to use for bathing. It may be in a bottle or in a prepackaged cloth to use on your skin. Carefully follow your health care provider's instructions and the instructions on the product label. Do not use CHG if you have a chlorhexidine allergy. Contact your health care provider if your skin gets irritated after scrubbing. This information is not intended to replace advice given to you by your health care provider. Make sure you  discuss any questions you have with your health care provider. Document Revised: 01/17/2021 Document Reviewed: 01/17/2021 Elsevier Patient Education  [place].

## 2022-04-28 ENCOUNTER — Encounter (HOSPITAL_COMMUNITY): Payer: Self-pay

## 2022-04-28 ENCOUNTER — Other Ambulatory Visit (HOSPITAL_COMMUNITY): Admission: RE | Admit: 2022-04-28 | Payer: BC Managed Care – PPO | Source: Ambulatory Visit

## 2022-04-28 ENCOUNTER — Encounter (HOSPITAL_COMMUNITY)
Admission: RE | Admit: 2022-04-28 | Discharge: 2022-04-28 | Disposition: A | Discharge status: Home / Self Care | Payer: BC Managed Care – PPO | Source: Ambulatory Visit | Attending: Obstetrics & Gynecology | Admitting: Obstetrics & Gynecology

## 2022-04-28 ENCOUNTER — Encounter: Payer: Self-pay | Admitting: Obstetrics & Gynecology

## 2022-04-28 VITALS — BP 112/71 | HR 62 | Temp 98.0°F | Resp 16 | Ht 67.0 in | Wt 292.0 lb

## 2022-04-28 DIAGNOSIS — Z01818 Encounter for other preprocedural examination: Secondary | ICD-10-CM

## 2022-04-28 DIAGNOSIS — Z01812 Encounter for preprocedural laboratory examination: Secondary | ICD-10-CM | POA: Insufficient documentation

## 2022-04-28 DIAGNOSIS — N939 Abnormal uterine and vaginal bleeding, unspecified: Secondary | ICD-10-CM

## 2022-04-28 HISTORY — DX: Sleep apnea, unspecified: G47.30

## 2022-04-28 LAB — CBC
HCT: 37.7 % — ABNORMAL LOW (ref 39.0–52.0)
Hemoglobin: 12.8 g/dL — ABNORMAL LOW (ref 13.0–17.0)
MCH: 28.6 pg (ref 26.0–34.0)
MCHC: 34 g/dL (ref 30.0–36.0)
MCV: 84.3 fL (ref 80.0–100.0)
Platelets: 214 10*3/uL (ref 150–400)
RBC: 4.47 MIL/uL (ref 4.22–5.81)
RDW: 12.6 % (ref 11.5–15.5)
WBC: 6 10*3/uL (ref 4.0–10.5)
nRBC: 0 % (ref 0.0–0.2)

## 2022-04-28 LAB — BASIC METABOLIC PANEL
Anion gap: 7 (ref 5–15)
BUN: 9 mg/dL (ref 6–20)
CO2: 25 mmol/L (ref 22–32)
Calcium: 8.9 mg/dL (ref 8.9–10.3)
Chloride: 106 mmol/L (ref 98–111)
Creatinine, Ser: 0.68 mg/dL (ref 0.61–1.24)
GFR, Estimated: >60 mL/min (ref >60)
Glucose, Bld: 92 mg/dL (ref 70–99)
Potassium: 3.7 mmol/L (ref 3.5–5.1)
Sodium: 138 mmol/L (ref 135–145)

## 2022-04-28 LAB — TYPE AND SCREEN
ABO/RH(D): B POS
Antibody Screen: NEGATIVE

## 2022-04-28 NOTE — Pre-Procedure Instructions (Signed)
Spoke with Dr Briant Cedar and Dr Nelda Marseille and due to patient's Nonbinary status, we do not have to get pregnancy test as patient requests and anesthesia criteria require.

## 2022-05-02 ENCOUNTER — Ambulatory Visit (HOSPITAL_COMMUNITY): Payer: BC Managed Care – PPO | Admitting: Anesthesiology

## 2022-05-02 ENCOUNTER — Encounter (HOSPITAL_COMMUNITY): Payer: Self-pay | Admitting: Obstetrics & Gynecology

## 2022-05-02 ENCOUNTER — Other Ambulatory Visit: Payer: Self-pay

## 2022-05-02 ENCOUNTER — Encounter (HOSPITAL_COMMUNITY)
Admission: RE | Disposition: A | Discharge status: Home / Self Care | Payer: Self-pay | Source: Home / Self Care | Attending: Obstetrics & Gynecology

## 2022-05-02 ENCOUNTER — Observation Stay (HOSPITAL_COMMUNITY)
Admission: RE | Admit: 2022-05-02 | Discharge: 2022-05-03 | Disposition: A | Discharge status: Home / Self Care | Payer: BC Managed Care – PPO | Attending: Obstetrics & Gynecology | Admitting: Obstetrics & Gynecology

## 2022-05-02 DIAGNOSIS — N879 Dysplasia of cervix uteri, unspecified: Secondary | ICD-10-CM | POA: Insufficient documentation

## 2022-05-02 DIAGNOSIS — D251 Intramural leiomyoma of uterus: Principal | ICD-10-CM | POA: Insufficient documentation

## 2022-05-02 DIAGNOSIS — Z79899 Other long term (current) drug therapy: Secondary | ICD-10-CM | POA: Insufficient documentation

## 2022-05-02 DIAGNOSIS — N72 Inflammatory disease of cervix uteri: Secondary | ICD-10-CM

## 2022-05-02 DIAGNOSIS — Z87891 Personal history of nicotine dependence: Secondary | ICD-10-CM | POA: Insufficient documentation

## 2022-05-02 DIAGNOSIS — Z01818 Encounter for other preprocedural examination: Secondary | ICD-10-CM

## 2022-05-02 DIAGNOSIS — N939 Abnormal uterine and vaginal bleeding, unspecified: Secondary | ICD-10-CM

## 2022-05-02 DIAGNOSIS — J45909 Unspecified asthma, uncomplicated: Secondary | ICD-10-CM | POA: Diagnosis not present

## 2022-05-02 DIAGNOSIS — N838 Other noninflammatory disorders of ovary, fallopian tube and broad ligament: Secondary | ICD-10-CM | POA: Insufficient documentation

## 2022-05-02 DIAGNOSIS — Z91040 Latex allergy status: Secondary | ICD-10-CM | POA: Insufficient documentation

## 2022-05-02 HISTORY — PX: ABDOMINAL HYSTERECTOMY: SHX81

## 2022-05-02 HISTORY — PX: BILATERAL SALPINGECTOMY: SHX5743

## 2022-05-02 LAB — ABO/RH: ABO/RH(D): B POS

## 2022-05-02 SURGERY — HYSTERECTOMY, ABDOMINAL
Anesthesia: General | Site: Abdomen

## 2022-05-02 MED ORDER — FENTANYL CITRATE (PF) 100 MCG/2ML IJ SOLN
INTRAMUSCULAR | Status: DC | PRN
  Administered 2022-05-02 (×2): 50 ug via INTRAVENOUS

## 2022-05-02 MED ORDER — SCOPOLAMINE 1 MG/3DAYS TD PT72: 1.0000 | MEDICATED_PATCH | TRANSDERMAL | Status: DC

## 2022-05-02 MED ORDER — ONDANSETRON HCL 4 MG PO TABS: 4.0000 mg | ORAL_TABLET | Freq: Four times a day (QID) | ORAL | Status: DC | PRN

## 2022-05-02 MED ORDER — HYDROMORPHONE HCL 1 MG/ML IJ SOLN
0.2500 mg | INTRAMUSCULAR | Status: DC | PRN
  Administered 2022-05-02 (×4): 0.5 mg via INTRAVENOUS
  Filled 2022-05-02: qty 0.5

## 2022-05-02 MED ORDER — LACTATED RINGERS IV SOLN: INTRAVENOUS | Status: DC

## 2022-05-02 MED ORDER — PROPOFOL 10 MG/ML IV BOLUS
INTRAVENOUS | Status: AC
  Filled 2022-05-02: qty 20

## 2022-05-02 MED ORDER — ONDANSETRON HCL 4 MG/2ML IJ SOLN
4.0000 mg | Freq: Four times a day (QID) | INTRAMUSCULAR | Status: DC | PRN
  Administered 2022-05-02: 4 mg via INTRAVENOUS
  Filled 2022-05-02: qty 2

## 2022-05-02 MED ORDER — DIPHENHYDRAMINE HCL 50 MG/ML IJ SOLN
INTRAMUSCULAR | Status: DC | PRN
  Administered 2022-05-02: 12.5 mg via INTRAVENOUS

## 2022-05-02 MED ORDER — DEXAMETHASONE SODIUM PHOSPHATE 4 MG/ML IJ SOLN
INTRAMUSCULAR | Status: DC | PRN
  Administered 2022-05-02: 5 mg via INTRAVENOUS

## 2022-05-02 MED ORDER — IBUPROFEN 400 MG PO TABS: 600.0000 mg | ORAL_TABLET | Freq: Four times a day (QID) | ORAL | Status: DC

## 2022-05-02 MED ORDER — MENTHOL 3 MG MT LOZG: 1.0000 | LOZENGE | OROMUCOSAL | Status: DC | PRN

## 2022-05-02 MED ORDER — SEVOFLURANE IN SOLN
RESPIRATORY_TRACT | Status: AC
  Filled 2022-05-02: qty 250

## 2022-05-02 MED ORDER — MIDAZOLAM HCL 2 MG/2ML IJ SOLN
INTRAMUSCULAR | Status: AC
  Filled 2022-05-02: qty 2

## 2022-05-02 MED ORDER — MIDAZOLAM HCL 5 MG/5ML IJ SOLN
INTRAMUSCULAR | Status: DC | PRN
  Administered 2022-05-02: 2 mg via INTRAVENOUS

## 2022-05-02 MED ORDER — ONDANSETRON HCL 4 MG/2ML IJ SOLN
4.0000 mg | Freq: Once | INTRAMUSCULAR | Status: DC | PRN
Start: 2022-05-02 — End: 2022-05-02

## 2022-05-02 MED ORDER — KETOROLAC TROMETHAMINE 30 MG/ML IJ SOLN
30.0000 mg | Freq: Four times a day (QID) | INTRAMUSCULAR | Status: AC
  Administered 2022-05-02 – 2022-05-03 (×4): 30 mg via INTRAVENOUS
  Filled 2022-05-02 (×4): qty 1

## 2022-05-02 MED ORDER — ACETAMINOPHEN 325 MG PO TABS
650.0000 mg | ORAL_TABLET | Freq: Four times a day (QID) | ORAL | Status: DC
  Administered 2022-05-02 – 2022-05-03 (×3): 650 mg via ORAL
  Filled 2022-05-02 (×4): qty 2

## 2022-05-02 MED ORDER — CHLORHEXIDINE GLUCONATE 0.12 % MT SOLN: 15.0000 mL | Freq: Once | OROMUCOSAL | Status: DC

## 2022-05-02 MED ORDER — GABAPENTIN 100 MG PO CAPS
ORAL_CAPSULE | ORAL | Status: AC
  Filled 2022-05-02: qty 2

## 2022-05-02 MED ORDER — OXYCODONE HCL 5 MG PO TABS
5.0000 mg | ORAL_TABLET | ORAL | Status: DC | PRN
  Administered 2022-05-02 – 2022-05-03 (×4): 5 mg via ORAL
  Filled 2022-05-02 (×3): qty 1

## 2022-05-02 MED ORDER — KETAMINE HCL 50 MG/5ML IJ SOSY
PREFILLED_SYRINGE | INTRAMUSCULAR | Status: AC
  Filled 2022-05-02: qty 5

## 2022-05-02 MED ORDER — DEXMEDETOMIDINE HCL IN NACL 80 MCG/20ML IV SOLN
INTRAVENOUS | Status: AC
  Filled 2022-05-02: qty 20

## 2022-05-02 MED ORDER — KETAMINE HCL 10 MG/ML IJ SOLN
INTRAMUSCULAR | Status: DC | PRN
  Administered 2022-05-02 (×4): 10 mg via INTRAVENOUS

## 2022-05-02 MED ORDER — PHENYLEPHRINE HCL (PRESSORS) 10 MG/ML IV SOLN
INTRAVENOUS | Status: DC | PRN
  Administered 2022-05-02 (×2): 80 ug via INTRAVENOUS

## 2022-05-02 MED ORDER — HEMOSTATIC AGENTS (NO CHARGE) OPTIME
TOPICAL | Status: DC | PRN
  Administered 2022-05-02: 1 via TOPICAL

## 2022-05-02 MED ORDER — KETOROLAC TROMETHAMINE 15 MG/ML IJ SOLN
30.0000 mg | INTRAMUSCULAR | Status: AC
  Administered 2022-05-02: 30 mg via INTRAVENOUS

## 2022-05-02 MED ORDER — ORAL CARE MOUTH RINSE: 15.0000 mL | Freq: Once | OROMUCOSAL | Status: DC

## 2022-05-02 MED ORDER — DOCUSATE SODIUM 100 MG PO CAPS
ORAL_CAPSULE | ORAL | Status: AC
  Filled 2022-05-02: qty 1

## 2022-05-02 MED ORDER — OXYCODONE HCL 5 MG PO TABS
ORAL_TABLET | ORAL | Status: AC
  Filled 2022-05-02: qty 1

## 2022-05-02 MED ORDER — HYDROMORPHONE HCL 1 MG/ML IJ SOLN
INTRAMUSCULAR | Status: AC
  Filled 2022-05-02: qty 0.5

## 2022-05-02 MED ORDER — FENTANYL CITRATE PF 50 MCG/ML IJ SOSY
25.0000 ug | PREFILLED_SYRINGE | INTRAMUSCULAR | Status: DC | PRN
  Administered 2022-05-02 (×2): 50 ug via INTRAVENOUS
  Filled 2022-05-02 (×2): qty 1

## 2022-05-02 MED ORDER — FENTANYL CITRATE (PF) 100 MCG/2ML IJ SOLN
INTRAMUSCULAR | Status: AC
Start: 2022-05-02 — End: ?
  Filled 2022-05-02: qty 2

## 2022-05-02 MED ORDER — ONDANSETRON HCL 4 MG/2ML IJ SOLN
INTRAMUSCULAR | Status: DC | PRN
  Administered 2022-05-02: 4 mg via INTRAVENOUS

## 2022-05-02 MED ORDER — ROCURONIUM BROMIDE 10 MG/ML (PF) SYRINGE
PREFILLED_SYRINGE | INTRAVENOUS | Status: DC | PRN
  Administered 2022-05-02 (×2): 50 mg via INTRAVENOUS

## 2022-05-02 MED ORDER — GABAPENTIN 300 MG PO CAPS
300.0000 mg | ORAL_CAPSULE | Freq: Three times a day (TID) | ORAL | Status: DC
  Administered 2022-05-02 – 2022-05-03 (×4): 300 mg via ORAL
  Filled 2022-05-02 (×3): qty 1

## 2022-05-02 MED ORDER — SCOPOLAMINE 1 MG/3DAYS TD PT72
MEDICATED_PATCH | TRANSDERMAL | Status: AC
  Administered 2022-05-02: 1.5 mg via TRANSDERMAL
  Filled 2022-05-02: qty 1

## 2022-05-02 MED ORDER — PROPOFOL 10 MG/ML IV BOLUS
INTRAVENOUS | Status: AC
Start: 2022-05-02 — End: ?
  Filled 2022-05-02: qty 20

## 2022-05-02 MED ORDER — ENOXAPARIN SODIUM 80 MG/0.8ML IJ SOSY
65.0000 mg | PREFILLED_SYRINGE | INTRAMUSCULAR | Status: DC
  Administered 2022-05-03: 65 mg via SUBCUTANEOUS
  Filled 2022-05-02 (×2): qty 0.8

## 2022-05-02 MED ORDER — SUGAMMADEX SODIUM 200 MG/2ML IV SOLN
INTRAVENOUS | Status: DC | PRN
  Administered 2022-05-02: 250 mg via INTRAVENOUS

## 2022-05-02 MED ORDER — ENOXAPARIN SODIUM 40 MG/0.4ML IJ SOSY: 40.0000 mg | PREFILLED_SYRINGE | INTRAMUSCULAR | Status: DC

## 2022-05-02 MED ORDER — BUPIVACAINE-MELOXICAM ER 200-6 MG/7ML IJ SOLN
INTRAMUSCULAR | Status: AC
  Filled 2022-05-02: qty 2

## 2022-05-02 MED ORDER — 0.9 % SODIUM CHLORIDE (POUR BTL) OPTIME
TOPICAL | Status: DC | PRN
  Administered 2022-05-02 (×2): 1000 mL

## 2022-05-02 MED ORDER — GLYCOPYRROLATE 0.2 MG/ML IJ SOLN
INTRAMUSCULAR | Status: DC | PRN
  Administered 2022-05-02: .2 mg via INTRAVENOUS

## 2022-05-02 MED ORDER — PROPOFOL 10 MG/ML IV BOLUS
INTRAVENOUS | Status: DC | PRN
  Administered 2022-05-02: 200 mg via INTRAVENOUS

## 2022-05-02 MED ORDER — DOCUSATE SODIUM 100 MG PO CAPS
100.0000 mg | ORAL_CAPSULE | Freq: Two times a day (BID) | ORAL | Status: DC
  Administered 2022-05-02 – 2022-05-03 (×3): 100 mg via ORAL
  Filled 2022-05-02 (×3): qty 1

## 2022-05-02 MED ORDER — MUPIROCIN 2 % EX OINT
TOPICAL_OINTMENT | CUTANEOUS | Status: AC
  Filled 2022-05-02: qty 22

## 2022-05-02 MED ORDER — GABAPENTIN 100 MG PO CAPS
ORAL_CAPSULE | ORAL | Status: AC
  Filled 2022-05-02: qty 1

## 2022-05-02 MED ORDER — SIMETHICONE 80 MG PO CHEW
80.0000 mg | CHEWABLE_TABLET | Freq: Four times a day (QID) | ORAL | Status: DC | PRN
  Administered 2022-05-02: 80 mg via ORAL
  Filled 2022-05-02: qty 1

## 2022-05-02 MED ORDER — BUPIVACAINE-MELOXICAM ER 200-6 MG/7ML IJ SOLN
INTRAMUSCULAR | Status: DC | PRN
  Administered 2022-05-02: 200 mg

## 2022-05-02 MED ORDER — CEFAZOLIN IN SODIUM CHLORIDE 3-0.9 GM/100ML-% IV SOLN
3.0000 g | INTRAVENOUS | Status: AC
  Administered 2022-05-02: 3 g via INTRAVENOUS
  Filled 2022-05-02: qty 100

## 2022-05-02 MED ORDER — ACETAMINOPHEN 10 MG/ML IV SOLN
INTRAVENOUS | Status: DC | PRN
  Administered 2022-05-02: 1000 mg via INTRAVENOUS

## 2022-05-02 MED ORDER — ACETAMINOPHEN 10 MG/ML IV SOLN
INTRAVENOUS | Status: AC
  Filled 2022-05-02: qty 100

## 2022-05-02 SURGICAL SUPPLY — 57 items
APPLICATOR COTTON TIP 6 STRL (MISCELLANEOUS) ×2 IMPLANT
APPLICATOR COTTON TIP 6IN STRL (MISCELLANEOUS) ×3
CLOTH BEACON ORANGE TIMEOUT ST (SAFETY) ×6 IMPLANT
COVER LIGHT HANDLE STERIS (MISCELLANEOUS) ×6 IMPLANT
COVER SURGICAL LIGHT HANDLE (MISCELLANEOUS) ×6 IMPLANT
DERMABOND ADVANCED (GAUZE/BANDAGES/DRESSINGS) ×1
DERMABOND ADVANCED .7 DNX12 (GAUZE/BANDAGES/DRESSINGS) ×2 IMPLANT
DRAPE WARM FLUID 44X44 (DRAPES) ×3 IMPLANT
DRSG OPSITE POSTOP 4X8 (GAUZE/BANDAGES/DRESSINGS) ×1 IMPLANT
DURAPREP 26ML APPLICATOR (WOUND CARE) ×3 IMPLANT
ELECT REM PT RETURN 9FT ADLT (ELECTROSURGICAL) ×3
ELECTRODE REM PT RTRN 9FT ADLT (ELECTROSURGICAL) ×2 IMPLANT
GAUZE 4X4 16PLY ~~LOC~~+RFID DBL (SPONGE) ×6 IMPLANT
GLOVE BIO SURGEON STRL SZ 6.5 (GLOVE) ×2 IMPLANT
GLOVE BIOGEL PI IND STRL 6.5 (GLOVE) ×2 IMPLANT
GLOVE BIOGEL PI IND STRL 7.0 (GLOVE) ×4 IMPLANT
GLOVE BIOGEL PI IND STRL 8 (GLOVE) IMPLANT
GLOVE BIOGEL PI INDICATOR 6.5 (GLOVE) ×2
GLOVE BIOGEL PI INDICATOR 7.0 (GLOVE) ×1
GLOVE BIOGEL PI INDICATOR 8 (GLOVE) ×1
GLOVE SRG 8 PF TXTR STRL LF DI (GLOVE) ×2 IMPLANT
GLOVE SURG GAMMEX PI TX LF 6.5 (GLOVE) ×3 IMPLANT
GLOVE SURG SS PI 6.5 STRL IVOR (GLOVE) ×2 IMPLANT
GLOVE SURG SS PI 8.0 STRL IVOR (GLOVE) ×1 IMPLANT
GLOVE SURG UNDER POLY LF SZ8 (GLOVE) ×1
GOWN STRL REUS W/ TWL LRG LVL3 (GOWN DISPOSABLE) ×2 IMPLANT
GOWN STRL REUS W/TWL LRG LVL3 (GOWN DISPOSABLE) ×7 IMPLANT
GOWN STRL REUS W/TWL XL LVL3 (GOWN DISPOSABLE) ×3 IMPLANT
KIT BLADEGUARD II DBL (SET/KITS/TRAYS/PACK) ×3 IMPLANT
KIT TURNOVER KIT A (KITS) ×6 IMPLANT
MANIFOLD NEPTUNE II (INSTRUMENTS) ×3 IMPLANT
NDL HYPO 21X1.5 SAFETY (NEEDLE) ×2 IMPLANT
NEEDLE HYPO 21X1.5 SAFETY (NEEDLE) ×3 IMPLANT
NS IRRIG 1000ML POUR BTL (IV SOLUTION) ×6 IMPLANT
PACK ABDOMINAL MAJOR (CUSTOM PROCEDURE TRAY) ×3 IMPLANT
PAD ARMBOARD 7.5X6 YLW CONV (MISCELLANEOUS) ×6 IMPLANT
PENCIL SMOKE EVACUATOR (MISCELLANEOUS) ×3 IMPLANT
POWDER SURGICEL 3.0 GRAM (HEMOSTASIS) ×1 IMPLANT
RETRACTOR WOUND ALXS 18CM MED (MISCELLANEOUS) ×2 IMPLANT
RTRCTR WOUND ALEXIS O 18CM MED (MISCELLANEOUS) ×3
SET BASIN LINEN APH (SET/KITS/TRAYS/PACK) ×6 IMPLANT
SOL PREP POV-IOD 4OZ 10% (MISCELLANEOUS) ×3 IMPLANT
SPONGE T-LAP 18X18 ~~LOC~~+RFID (SPONGE) ×6 IMPLANT
SUT CHROMIC 0 CT 1 (SUTURE) ×6 IMPLANT
SUT MON AB 3-0 SH 27 (SUTURE) IMPLANT
SUT VIC AB 0 CT1 27 (SUTURE) ×3
SUT VIC AB 0 CT1 27XBRD ANTBC (SUTURE) IMPLANT
SUT VIC AB 0 CT1 27XCR 8 STRN (SUTURE) ×4 IMPLANT
SUT VIC AB 0 CT1 36 (SUTURE) ×3 IMPLANT
SUT VIC AB 0 CTX 36 (SUTURE)
SUT VIC AB 0 CTX36XBRD ANTBCTR (SUTURE) ×4 IMPLANT
SUT VIC AB 2-0 CT1 27 (SUTURE) ×1
SUT VIC AB 2-0 CT1 TAPERPNT 27 (SUTURE) ×2 IMPLANT
SUT VICRYL 3 0 (SUTURE) ×3 IMPLANT
TOWEL ~~LOC~~+RFID 17X26 BLUE (SPONGE) ×3 IMPLANT
TRAY FOL W/BAG SLVR 16FR STRL (SET/KITS/TRAYS/PACK) IMPLANT
TRAY FOLEY W/BAG SLVR 16FR LF (SET/KITS/TRAYS/PACK) ×1

## 2022-05-02 NOTE — Op Note (Signed)
Preoperative diagnosis:  1.  Abnormal uterine bleeding                                        Postoperative diagnosis:  Same as above  Procedure:  Abdominal hysterectomy and bilateral salpingectomy  Surgeon:  Dr. Janyth Pupa  Assistant:  Dr. Darlis Loan  Anesthesia:  General endotracheal  Intraoperative findings: 6cm uterus with normal ovaries and fallopian tubes bilaterally  Description of operation:  Patient was taken to the operating room and placed in the supine position where she underwent general endotracheal anesthesia.  She was then prepped and draped in the usual sterile fashion and a Foley catheter was placed for continuous bladder drainage.  A Pfannenstiel skin incision was made and carried down sharply to the rectus fascia which was scored in the midline and extended laterally.  The fascia was taken off the muscles superiorly and inferiorly without difficulty.  The muscles were divided.  The peritoneal cavity was entered.  An medium Alexis self-retaining retractor was placed.  The upper abdomen was packed away. Both uterine cornu were grasped with Kocker clamps.  The left round ligament and utero-ovarian ligament  were cross clamped, cut and suture ligated.  The left vesicouterine serosal flap was created.  In a similar fashion, the right round ligament and utero-ovarian ligament were clamped cut and suture ligated. The vesicouterine serosal flap on the right was created.  Thus both ovaries were preserved.  The uterine vessels were skeletonized bilaterally.  The uterine vessels were clamped bilaterally,  then cut and suture ligated.  Additional pedicles were taken down the cervix medial to the uterine vessels.  Each pedicle was clamped cut and suture ligated with good resulting hemostasis.      Serial pedicles were taken medial to the utero ovarian pedicle.  Each pedicle was clamped cut and transfixion suture ligated.   The uterine vessels were skeletonized bilaterally.  The uterine  vessels were clamped bilaterally,  then cut and suture ligated. Several  more pedicles were taken down the cervix medial to the uterine vessels.  Each pedicle was clamped cut and suture ligated with good resulting hemostasis.The vagina was cross clamped and the uterus with cervix was removed.  The vagina was closed with interrupted figure of eight sutures with good hemostasis.     Attention was turned to the left adnexa.  The left fallopian tube was grasped with a babcock and using a Claiborne Billings was cut, clamped and suture ligated.  A similar procedure was carried out on the right side.  Thus the ovaries were preserved. Both fallopian tubes were sent to pathology along with the other specimen.     The pelvis was irrigated vigorously and all pedicles were examined and found to be hemostatic.  Surgicel  was used for additional hemostasis.  The muscles and peritoneum were reapproximated loosely.  The fascia was closed with 0 Vicryl running. Zyrnrelief was injected into the subcutaneous tissue for post operative pain management.   The skin was closed using 3-0 Vicryl on a Keith needle in a subcuticular fashion.  Dermabond was then applied for additional wound integrity and to serve as a postoperative bacterial barrier.    The patient was awakened from anesthesia taken to the recovery room in good stable condition. All sponge instrument and needle counts were correct.  The patient received Ancef and Toradol prophylactically preoperatively.  Janyth Pupa, DO Attending Obstetrician &  Gynecologist, Product/process development scientist for [employer], [place]

## 2022-05-02 NOTE — Progress Notes (Signed)
Has been up and down numerous times from bed to bathroom and back to bed or chair.  Ate some lunch.  Pain rated around a 7 and received prn oxycodone with scheduled tylenol.  Just now c/o nausea and received zofran.  Sig other at bedside and very helpful.

## 2022-05-02 NOTE — Progress Notes (Signed)
Instructed on incentive spirometer. 750 ml obtained. Tolerated well. 

## 2022-05-02 NOTE — Progress Notes (Signed)
Pt had tylenol 1000 mg IV in OR at 0900. Dr Nelda Marseille notified. Instructed to hold 0945 tylenol dose. Start giving tylenol at 1500.

## 2022-05-02 NOTE — Progress Notes (Signed)
Obstetric status reviewed by a previous nursing note stating that urine HCG point of care test is not required per Dr. Nelda Marseille and Dr. Briant Cedar.

## 2022-05-02 NOTE — Progress Notes (Signed)
From pacu earlier.  Drowsy and family at bedside.  Dressing to abd dry and intact.  Later asked to have foley out and received order to remove as well as have cpap at night.  Foley removed and ambulated to bathroom with walker and standby.  Voided small amount.  Scant amount of blood on toilet paper and scant amount on peripad.  New peripad and underwear.  Sitting in chair now.

## 2022-05-02 NOTE — Anesthesia Procedure Notes (Signed)
Procedure Name: Intubation Date/Time: 05/02/2022 7:43 AM  Performed by: Louann Sjogren, MDPre-anesthesia Checklist: Patient identified, Emergency Drugs available, Suction available, Patient being monitored and Timeout performed Patient Re-evaluated:Patient Re-evaluated prior to induction Oxygen Delivery Method: Circle system utilized Preoxygenation: Pre-oxygenation with 100% oxygen Induction Type: IV induction Ventilation: Mask ventilation without difficulty Laryngoscope Size: Miller and 2 Grade View: Grade I Tube type: Oral Tube size: 7.5 mm Number of attempts: 1 Airway Equipment and Method: Patient positioned with wedge pillow and Stylet Placement Confirmation: breath sounds checked- equal and bilateral Secured at: 23 cm Tube secured with: Tape Dental Injury: Teeth and Oropharynx as per pre-operative assessment

## 2022-05-02 NOTE — Anesthesia Postprocedure Evaluation (Signed)
Anesthesia Post Note  Patient: Janiel Crisostomo  Procedure(s) Performed: HYSTERECTOMY ABDOMINAL (Abdomen) OPEN BILATERAL SALPINGECTOMY (Bilateral: Abdomen)  Patient location during evaluation: Phase II Anesthesia Type: General Level of consciousness: awake Pain management: pain level controlled Vital Signs Assessment: post-procedure vital signs reviewed and stable Respiratory status: spontaneous breathing and respiratory function stable Cardiovascular status: blood pressure returned to baseline and stable Postop Assessment: no headache and no apparent nausea or vomiting Anesthetic complications: no Comments: Late entry   No notable events documented.   Last Vitals:  Vitals:   05/02/22 0956 05/02/22 1000  BP:    Pulse: 62 (!) 58  Resp: 19 16  Temp:    SpO2: 100% 97%    Last Pain:  Vitals:   05/02/22 0939  TempSrc:   PainSc: 0-No pain                 Louann Sjogren

## 2022-05-02 NOTE — Anesthesia Preprocedure Evaluation (Signed)
Anesthesia Evaluation  Patient identified by MRN, date of birth, ID band Patient awake    Reviewed: Allergy & Precautions, H&P , NPO status , Patient's Chart, lab work & pertinent test results, reviewed documented beta blocker date and time   Airway Mallampati: II  TM Distance: >3 FB Neck ROM: full    Dental no notable dental hx.    Pulmonary asthma , former smoker,    Pulmonary exam normal breath sounds clear to auscultation       Cardiovascular Exercise Tolerance: Good negative cardio ROS   Rhythm:regular Rate:Normal     Neuro/Psych  Headaches, PSYCHIATRIC DISORDERS Anxiety    GI/Hepatic negative GI ROS, Neg liver ROS,   Endo/Other  Morbid obesity  Renal/GU negative Renal ROS  negative genitourinary   Musculoskeletal   Abdominal   Peds  Hematology negative hematology ROS (+)   Anesthesia Other Findings   Reproductive/Obstetrics negative OB ROS                             Anesthesia Physical Anesthesia Plan  ASA: 3  Anesthesia Plan: General and General ETT   Post-op Pain Management:    Induction:   PONV Risk Score and Plan: Ondansetron  Airway Management Planned:   Additional Equipment:   Intra-op Plan:   Post-operative Plan:   Informed Consent: I have reviewed the patients History and Physical, chart, labs and discussed the procedure including the risks, benefits and alternatives for the proposed anesthesia with the patient or authorized representative who has indicated his/her understanding and acceptance.     Dental Advisory Given  Plan Discussed with: CRNA  Anesthesia Plan Comments:         Anesthesia Quick Evaluation

## 2022-05-02 NOTE — Transfer of Care (Signed)
Immediate Anesthesia Transfer of Care Note  Patient: [name]  Procedure(s) Performed: HYSTERECTOMY ABDOMINAL (Abdomen) OPEN BILATERAL SALPINGECTOMY (Bilateral: Abdomen)  Patient Location: PACU  Anesthesia Type:General  Level of Consciousness: sedated and drowsy  Airway & Oxygen Therapy: Patient connected to face mask oxygen  Post-op Assessment: Report given to RN, Post -op Vital signs reviewed and stable and Patient moving all extremities X 4  Post vital signs: Reviewed and stable  Last Vitals:  Vitals Value Taken Time  BP 99/56 05/02/22 0939  Temp    Pulse 57 05/02/22 0940  Resp 19 05/02/22 0940  SpO2 97 % 05/02/22 0940  Vitals shown include unvalidated device data.  Last Pain:  Vitals:   05/02/22 0629  TempSrc: Oral  PainSc: 0-No pain         Complications: No notable events documented.

## 2022-05-03 DIAGNOSIS — D251 Intramural leiomyoma of uterus: Secondary | ICD-10-CM | POA: Diagnosis not present

## 2022-05-03 LAB — SURGICAL PATHOLOGY

## 2022-05-03 MED ORDER — IBUPROFEN 600 MG PO TABS: 600.0000 mg | ORAL_TABLET | Freq: Four times a day (QID) | ORAL | 0 refills | Status: DC | PRN

## 2022-05-03 MED ORDER — DOCUSATE SODIUM 100 MG PO CAPS: 100.0000 mg | ORAL_CAPSULE | Freq: Two times a day (BID) | ORAL | 0 refills | Status: DC

## 2022-05-03 MED ORDER — OXYCODONE HCL 5 MG PO TABS: 5.0000 mg | ORAL_TABLET | Freq: Four times a day (QID) | ORAL | 0 refills | Status: AC | PRN

## 2022-05-03 MED ORDER — GABAPENTIN 300 MG PO CAPS: 300.0000 mg | ORAL_CAPSULE | Freq: Three times a day (TID) | ORAL | 0 refills | Status: DC

## 2022-05-03 MED ORDER — ACETAMINOPHEN 325 MG PO TABS: 650.0000 mg | ORAL_TABLET | Freq: Four times a day (QID) | ORAL | Status: DC

## 2022-05-03 NOTE — Discharge Summary (Signed)
Physician Discharge Summary  Patient ID: [name] MRN: [id number] DOB/AGE: 41-20-1982 41 y.o.  Admit date: 05/02/2022 Discharge date: 05/03/2022  Admission Diagnoses: Abnormal uterine bleeding  Discharge Diagnoses:  Principal Problem:   Abnormal uterine bleeding   Discharged Condition: stable  Hospital Course: 41yo G0 who was admitted for scheduled TAH, BS due to AUB.  For information regarding the procedure, please see separate operative note.  Postop course was uncomplicated and pt was discharged home on POD#1 in stable condition.  Consults: None  Significant Diagnostic Studies: labs: No results found for this or any previous visit (from the past 24 hour(s)).   Treatments: IV hydration, antibiotics: Ancef, analgesia: Toradol, and surgery: TAH, BS  Discharge Exam: Blood pressure (!) 123/58, pulse 77, temperature 97.7 F (36.5 C), resp. rate 16, height '5\' 7"'$  (1.702 m), weight 132.5 kg, SpO2 99 %. General appearance: alert, cooperative, and no distress Resp: clear to auscultation bilaterally Cardio: regular rate and rhythm GI: obese, soft and appropriately tender, BS quiet Extremities: no edema, no calf tenderness bilaterally Skin: warm and dry Neurologic: Grossly normal Incision: honeycomb in place, clean and dry  Disposition: Discharge disposition: 01-Home or Self Care        Allergies as of 05/03/2022       Reactions   Latex Hives, Swelling   Chantix [varenicline] Other (See Comments)   Hallucinations, sleep walking Other reaction(s): sleepwalking, hallucinations   Fish Allergy Hives, Itching   HEADACHE   Imitrex [sumatriptan] Other (See Comments)   TACHYCARDIA   Povidone Iodine Itching, Swelling   Other reaction(s): sores   Shellfish-derived Products    Other reaction(s): Unknown   Wellbutrin [bupropion]    HEADACHES   Zithromax [azithromycin] Other (See Comments)   hypotension   Adhesive [tape] Rash        Medication List     STOP  taking these medications    meloxicam 15 MG tablet Commonly known as: MOBIC       TAKE these medications    acetaminophen 325 MG tablet Commonly known as: TYLENOL Take 2 tablets (650 mg total) by mouth every 6 (six) hours.   Ajovy 225 MG/1.5ML Soaj Generic drug: Fremanezumab-vfrm Inject 225 mg into the skin every 30 (thirty) days.   albuterol 108 (90 Base) MCG/ACT inhaler Commonly known as: VENTOLIN HFA Inhale 2 puffs into the lungs every 6 (six) hours as needed for wheezing or shortness of breath.   clonazePAM 0.5 MG tablet Commonly known as: KLONOPIN Take 0.5 mg by mouth at bedtime.   cyclobenzaprine 5 MG tablet Commonly known as: FLEXERIL Take 5 mg by mouth 2 (two) times daily as needed for muscle spasms.   docusate sodium 100 MG capsule Commonly known as: COLACE Take 1 capsule (100 mg total) by mouth 2 (two) times daily.   famotidine 20 MG tablet Commonly known as: PEPCID Take 20 mg by mouth 2 (two) times daily.   gabapentin 300 MG capsule Commonly known as: NEURONTIN Take 1 capsule (300 mg total) by mouth 3 (three) times daily for 3 days.   hydrOXYzine 50 MG tablet Commonly known as: ATARAX Take 25-50 mg by mouth See admin instructions. 25 mg in the morning, 50 mg at bedtime   ibuprofen 600 MG tablet Commonly known as: ADVIL Take 1 tablet (600 mg total) by mouth every 6 (six) hours as needed.   meclizine 12.5 MG tablet Commonly known as: ANTIVERT Take 1 tablet (12.5 mg total) by mouth 3 (three) times daily as needed for dizziness.  multivitamin tablet Take 1 tablet by mouth daily.   Nurtec 75 MG Tbdp Generic drug: Rimegepant Sulfate Take 75 mg by mouth daily as needed.   ondansetron 4 MG disintegrating tablet Commonly known as: ZOFRAN-ODT Take 1 tablet (4 mg total) by mouth every 8 (eight) hours as needed for nausea.   oxyCODONE 5 MG immediate release tablet Commonly known as: Oxy IR/ROXICODONE Take 1 tablet (5 mg total) by mouth every 6 (six)  hours as needed for up to 7 days for moderate pain.   prazosin 2 MG capsule Commonly known as: MINIPRESS Take 2 mg by mouth at bedtime.   promethazine 25 MG tablet Commonly known as: PHENERGAN Take 1 tablet (25 mg total) by mouth every 6 (six) hours as needed for nausea or vomiting.   propranolol 20 MG tablet Commonly known as: INDERAL Take 1 tablet (20 mg total) by mouth 3 (three) times daily. 10 mg in the afternoon What changed:  how much to take when to take this additional instructions   venlafaxine XR 150 MG 24 hr capsule Commonly known as: EFFEXOR-XR Take 150 mg by mouth daily.        Follow-up Information     Janyth Pupa, DO Follow up.   Specialty: Obstetrics and Gynecology Why: follow up as scheduled Contact information: [place] [address] [phone]                 Signed: Annalee Genta 05/03/2022, 8:09 AM

## 2022-05-03 NOTE — Discharge Instructions (Signed)
HOME INSTRUCTIONS  Please note any unusual or excessive bleeding, pain, swelling. Mild dizziness or drowsiness are normal for about 24 hours after surgery.   Shower when comfortable  Restrictions: No driving for 24 hours or while taking pain medications.  Activity:  No heavy lifting (> 10 lbs), nothing in vagina (no tampons, douching, or intercourse) x 4 weeks; no tub baths for 4 weeks Vaginal spotting is expected but if your bleeding is heavy, period like,  please call the office   Incision: You can remove the honeycomb dressing tomorrow.  The bandaids (dermabond) will fall off when they are ready to; you may clean your incision with mild soap and water but do not rub or scrub the incision site.  You may experience slight bloody drainage from your incision periodically.  This is normal.  If you experience a large amount of drainage or the incision opens, please call your physician who will likely direct you to the emergency department.  Diet:  You may return to your regular diet.  Do not eat large meals.  Eat small frequent meals throughout the day.  Continue to drink a good amount of water at least 6-8 glasses of water per day, hydration is very important for the healing process.  Pain Management: see below for pain management instructions.   Always take prescription pain medication with food.  Oxycodone will cause constipation, take a stool softener while taking this medication.  A prescription of colace has been sent in to take twice daily if needed while taking the oxycodone.  Be sure to drink plenty of fluids and increase your fiber to help with constipation.  Alcohol -- Avoid for 24 hours and while taking pain medications.  Nausea: Take sips of ginger ale or soda  Fever -- Call physician if temperature over 101 degrees  Follow up:  If you do not already have a follow up appointment scheduled, please call the office at [phone].  If you experience fever (a temperature greater than  100.4), pain unrelieved by pain medication, shortness of breath, swelling of a single leg, or any other symptoms which are concerning to you please the office immediately.    Post Operative Pain Med Plan:  >Take gabapentin 300 mg three times per day, as prescribed for 3 days, try to space them evenly  >Take the oxycodone- 1 tablet, on a schedule, around the clock, every 6 hours(set your phone alarm) for the first 2 days, there may be times when you will need 2 tablets but taking them on a schedule will decrease this need  >You can also take Tylenol together with the oxycodone.  If the oxycodone seems "to strong" then just take Tylenol  >Oxycodone will cause constipation, please be sure to take a stool softener (Colace) twice daily while taking this pain medication and/or continue this medication until your bowel regimen returns to normal  >Take the Ibuprofen '600mg'$  every 6 hours as needed  If possible take the Ibuprofen with food to help avoid upsetting your stomach  >Use a heating pad as well as needed  >I have also sent a prescription for zofran (ondansetron) for nausea to take if needed over the first couple of days  >Be gentle with your diet the first few days, liquids and soft non spicy food, fruits are great  >Get up and move, no lifting or straining

## 2022-05-03 NOTE — Progress Notes (Signed)
Patient has ambulated several times throughout the night (in room and to the bathroom). He has received Oxycodone 5 mg x 2 this shift with pain levels averaging 5. The pain level desired/tolerable at discharge is 4. There is no blood present on honeycomb abdominal dressing and patient reports he has had approximately 8 drops on peri-pad since procedure. Mylicon 80 mg given at [place].

## 2022-05-03 NOTE — Progress Notes (Signed)
Nsg Discharge Note  Admit Date:  05/02/2022 Discharge date: 05/03/2022   Fleet Contras to be D/C'd Home per MD order.  AVS completed.  Copy for chart, and copy for patient signed, and dated. Patient/caregiver able to verbalize understanding.  Discharge Medication: Allergies as of 05/03/2022       Reactions   Latex Hives, Swelling   Chantix [varenicline] Other (See Comments)   Hallucinations, sleep walking Other reaction(s): sleepwalking, hallucinations   Fish Allergy Hives, Itching   HEADACHE   Imitrex [sumatriptan] Other (See Comments)   TACHYCARDIA   Povidone Iodine Itching, Swelling   Other reaction(s): sores   Shellfish-derived Products    Other reaction(s): Unknown   Wellbutrin [bupropion]    HEADACHES   Zithromax [azithromycin] Other (See Comments)   hypotension   Adhesive [tape] Rash        Medication List     STOP taking these medications    meloxicam 15 MG tablet Commonly known as: MOBIC       TAKE these medications    acetaminophen 325 MG tablet Commonly known as: TYLENOL Take 2 tablets (650 mg total) by mouth every 6 (six) hours.   Ajovy 225 MG/1.5ML Soaj Generic drug: Fremanezumab-vfrm Inject 225 mg into the skin every 30 (thirty) days.   albuterol 108 (90 Base) MCG/ACT inhaler Commonly known as: VENTOLIN HFA Inhale 2 puffs into the lungs every 6 (six) hours as needed for wheezing or shortness of breath.   clonazePAM 0.5 MG tablet Commonly known as: KLONOPIN Take 0.5 mg by mouth at bedtime.   cyclobenzaprine 5 MG tablet Commonly known as: FLEXERIL Take 5 mg by mouth 2 (two) times daily as needed for muscle spasms.   docusate sodium 100 MG capsule Commonly known as: COLACE Take 1 capsule (100 mg total) by mouth 2 (two) times daily.   famotidine 20 MG tablet Commonly known as: PEPCID Take 20 mg by mouth 2 (two) times daily.   gabapentin 300 MG capsule Commonly known as: NEURONTIN Take 1 capsule (300 mg total) by mouth 3 (three)  times daily for 3 days.   hydrOXYzine 50 MG tablet Commonly known as: ATARAX Take 25-50 mg by mouth See admin instructions. 25 mg in the morning, 50 mg at bedtime   ibuprofen 600 MG tablet Commonly known as: ADVIL Take 1 tablet (600 mg total) by mouth every 6 (six) hours as needed.   meclizine 12.5 MG tablet Commonly known as: ANTIVERT Take 1 tablet (12.5 mg total) by mouth 3 (three) times daily as needed for dizziness.   multivitamin tablet Take 1 tablet by mouth daily.   Nurtec 75 MG Tbdp Generic drug: Rimegepant Sulfate Take 75 mg by mouth daily as needed.   ondansetron 4 MG disintegrating tablet Commonly known as: ZOFRAN-ODT Take 1 tablet (4 mg total) by mouth every 8 (eight) hours as needed for nausea.   oxyCODONE 5 MG immediate release tablet Commonly known as: Oxy IR/ROXICODONE Take 1 tablet (5 mg total) by mouth every 6 (six) hours as needed for up to 7 days for moderate pain.   prazosin 2 MG capsule Commonly known as: MINIPRESS Take 2 mg by mouth at bedtime.   promethazine 25 MG tablet Commonly known as: PHENERGAN Take 1 tablet (25 mg total) by mouth every 6 (six) hours as needed for nausea or vomiting.   propranolol 20 MG tablet Commonly known as: INDERAL Take 1 tablet (20 mg total) by mouth 3 (three) times daily. 10 mg in the afternoon What changed:  how much to take when to take this additional instructions   venlafaxine XR 150 MG 24 hr capsule Commonly known as: EFFEXOR-XR Take 150 mg by mouth daily.        Discharge Assessment: Vitals:   05/03/22 0042 05/03/22 0523  BP: (!) 109/58 (!) 123/58  Pulse: 86 77  Resp: 16 16  Temp: 98 F (36.7 C) 97.7 F (36.5 C)  SpO2: 99% 99%   Skin clean, dry and intact without evidence of skin break down, no evidence of skin tears noted. IV catheter discontinued intact. Site without signs and symptoms of complications - no redness or edema noted at insertion site, patient denies c/o pain - only slight  tenderness at site.  Dressing with slight pressure applied.  D/c Instructions-Education: Discharge instructions given to patient/family with verbalized understanding. D/c education completed with patient/family including follow up instructions, medication list, d/c activities limitations if indicated, with other d/c instructions as indicated by MD - patient able to verbalize understanding, all questions fully answered. Patient instructed to return to ED, call 911, or call MD for any changes in condition.  Patient escorted via [place], and D/C home via private auto.  Loa Socks, RN 05/03/2022 9:25 AM

## 2022-05-05 ENCOUNTER — Encounter (HOSPITAL_COMMUNITY): Payer: Self-pay | Admitting: Obstetrics & Gynecology

## 2022-05-05 ENCOUNTER — Encounter: Payer: Self-pay | Admitting: Obstetrics & Gynecology

## 2022-05-17 ENCOUNTER — Telehealth (INDEPENDENT_AMBULATORY_CARE_PROVIDER_SITE_OTHER): Payer: BC Managed Care – PPO | Admitting: Obstetrics & Gynecology

## 2022-05-17 ENCOUNTER — Encounter: Payer: Self-pay | Admitting: Obstetrics & Gynecology

## 2022-05-17 DIAGNOSIS — Z4889 Encounter for other specified surgical aftercare: Secondary | ICD-10-CM
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
## 2022-06-13 ENCOUNTER — Encounter: Payer: BC Managed Care – PPO | Admitting: Obstetrics & Gynecology

## 2022-06-13 ENCOUNTER — Encounter: Payer: Self-pay | Admitting: Obstetrics & Gynecology

## 2022-07-05 ENCOUNTER — Other Ambulatory Visit: Payer: Self-pay | Admitting: Neurology

## 2022-12-16 ENCOUNTER — Other Ambulatory Visit: Payer: Self-pay | Admitting: Cardiology

## 2022-12-16 DIAGNOSIS — R55 Syncope and collapse: Secondary | ICD-10-CM

## 2023-01-03 HISTORY — PX: BREAST REDUCTION SURGERY: SHX8

## 2023-04-27 ENCOUNTER — Ambulatory Visit: Payer: BC Managed Care – PPO | Admitting: Cardiology

## 2023-05-03 ENCOUNTER — Other Ambulatory Visit: Payer: Self-pay | Admitting: Family Medicine

## 2023-05-03 ENCOUNTER — Telehealth: Payer: Self-pay | Admitting: Neurology

## 2023-05-03 DIAGNOSIS — R42 Dizziness and giddiness: Secondary | ICD-10-CM

## 2023-05-03 DIAGNOSIS — R202 Paresthesia of skin: Secondary | ICD-10-CM

## 2023-05-03 DIAGNOSIS — R269 Unspecified abnormalities of gait and mobility: Secondary | ICD-10-CM

## 2023-05-03 NOTE — Telephone Encounter (Signed)
Called pt, scheduled her to see Dr. Lucia Gaskins on 9/17 @ 8:00 am

## 2023-07-01 ENCOUNTER — Encounter: Payer: Self-pay | Admitting: Neurology

## 2023-07-10 ENCOUNTER — Ambulatory Visit (INDEPENDENT_AMBULATORY_CARE_PROVIDER_SITE_OTHER): Payer: BC Managed Care – PPO | Admitting: Neurology

## 2023-07-10 ENCOUNTER — Encounter: Payer: Self-pay | Admitting: Neurology

## 2023-07-10 VITALS — BP 105/69 | HR 75 | Ht 67.0 in | Wt 240.0 lb

## 2023-07-10 DIAGNOSIS — H532 Diplopia: Secondary | ICD-10-CM

## 2023-07-10 DIAGNOSIS — G360 Neuromyelitis optica [Devic]: Secondary | ICD-10-CM

## 2023-07-10 DIAGNOSIS — H5500 Unspecified nystagmus: Secondary | ICD-10-CM

## 2023-07-10 DIAGNOSIS — Z79899 Other long term (current) drug therapy: Secondary | ICD-10-CM

## 2023-07-10 DIAGNOSIS — H5713 Ocular pain, bilateral: Secondary | ICD-10-CM | POA: Diagnosis not present

## 2023-07-10 DIAGNOSIS — R29898 Other symptoms and signs involving the musculoskeletal system: Secondary | ICD-10-CM

## 2023-07-10 DIAGNOSIS — M542 Cervicalgia: Secondary | ICD-10-CM

## 2023-07-10 DIAGNOSIS — G43009 Migraine without aura, not intractable, without status migrainosus: Secondary | ICD-10-CM

## 2023-07-10 DIAGNOSIS — R270 Ataxia, unspecified: Secondary | ICD-10-CM

## 2023-07-10 DIAGNOSIS — G43709 Chronic migraine without aura, not intractable, without status migrainosus: Secondary | ICD-10-CM

## 2023-07-10 MED ORDER — "BD SAFETYGLIDE SYRINGE/NEEDLE 25G X 1"" 3 ML MISC": 5 refills | Status: DC

## 2023-07-10 MED ORDER — NURTEC 75 MG PO TBDP: 75.0000 mg | ORAL_TABLET | Freq: Every day | ORAL | 11 refills | Status: DC | PRN

## 2023-07-10 MED ORDER — KETOROLAC TROMETHAMINE 60 MG/2ML IM SOLN: INTRAMUSCULAR | 4 refills | Status: DC
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
## 2023-07-11 ENCOUNTER — Ambulatory Visit (INDEPENDENT_AMBULATORY_CARE_PROVIDER_SITE_OTHER): Payer: BC Managed Care – PPO

## 2023-07-11 ENCOUNTER — Encounter: Payer: Self-pay | Admitting: Neurology

## 2023-07-11 DIAGNOSIS — H5500 Unspecified nystagmus: Secondary | ICD-10-CM

## 2023-07-11 DIAGNOSIS — H532 Diplopia: Secondary | ICD-10-CM

## 2023-07-11 DIAGNOSIS — R270 Ataxia, unspecified: Secondary | ICD-10-CM | POA: Diagnosis not present

## 2023-07-11 DIAGNOSIS — H5713 Ocular pain, bilateral: Secondary | ICD-10-CM | POA: Diagnosis not present

## 2023-07-11 DIAGNOSIS — M542 Cervicalgia: Secondary | ICD-10-CM

## 2023-07-11 DIAGNOSIS — G360 Neuromyelitis optica [Devic]: Secondary | ICD-10-CM | POA: Diagnosis not present

## 2023-07-11 MED ORDER — GADOBENATE DIMEGLUMINE 529 MG/ML IV SOLN
20.0000 mL | Freq: Once | INTRAVENOUS | Status: AC | PRN
  Administered 2023-07-11: 20 mL via INTRAVENOUS

## 2023-07-11 MED ORDER — AJOVY 225 MG/1.5ML ~~LOC~~ SOAJ
225.0000 mg | SUBCUTANEOUS | 3 refills | Status: AC
Start: 2023-07-11 — End: ?

## 2023-07-12 ENCOUNTER — Ambulatory Visit: Payer: BC Managed Care – PPO | Admitting: Neurology

## 2023-07-12 ENCOUNTER — Ambulatory Visit (INDEPENDENT_AMBULATORY_CARE_PROVIDER_SITE_OTHER): Payer: Self-pay | Admitting: Neurology

## 2023-07-12 DIAGNOSIS — G5621 Lesion of ulnar nerve, right upper limb: Secondary | ICD-10-CM

## 2023-07-12 DIAGNOSIS — R29898 Other symptoms and signs involving the musculoskeletal system: Secondary | ICD-10-CM

## 2023-07-12 DIAGNOSIS — Z0289 Encounter for other administrative examinations: Secondary | ICD-10-CM

## 2023-07-14 LAB — NEUROMYELITIS OPTICA AUTOAB, IGG: NMO IgG Autoantibodies: <1.5 U/mL (ref 0.0–3.0)

## 2023-07-16 NOTE — Progress Notes (Signed)
Full Name: [name] Gender: Nonbinary  MRN #: [id number] Date of Birth: [birth year]    Visit Date: 07/12/2023 15:32 Age: 42 Years Examining Physician: Dr. Naomie Dean Referring Physician: Dr. Naomie Dean Height: 5 feet 7 inch  History: Right arm pain at the elbow and radiating to 4th,5th digits.   Summary: Nerve Conduction Studies were performed on the bilateral upper extremities: The right Ulnar motor study (when recording from the FDI) showed a 30m/s drop across the elbow (N<10). All remaining nerves(as indicated in the following tables) were within normal limits.   EMG was performed on the right upper extremity. All muscles (as indicated in the following tables) were within normal limits.      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    ------------------------------- Naomie Dean , M.D.  [facility] [address] [place], [place] [address] Tel: [phone] Fax: [phone]  Verbal informed consent was obtained from the patient, patient was informed of potential risk of procedure, including bruising, bleeding, hematoma formation, infection, muscle weakness, muscle pain, numbness, among others.        MNC    Nerve / Sites Muscle Latency Ref. Amplitude Ref. Rel Amp Segments Distance Velocity Ref. Area    ms ms mV mV %  cm m/s m/s mVms  R Median - APB     Wrist APB 3.0 ?4.4 9.0 ?4.0 100 Wrist - APB 7   38.7     Upper arm APB 7.2  8.4  93.3 Upper arm - Wrist 24 58 ?49 35.5  L Median - APB     Wrist APB 2.7 ?4.4 5.7 ?4.0 100 Wrist - APB 7   25.9     Upper arm APB 7.2  5.7  100 Upper arm - Wrist 26.6 59 ?49 25.0  R Ulnar - ADM     Wrist ADM 2.4 ?3.3 9.3 ?6.0 100 Wrist - ADM 7   35.8     B.Elbow ADM 4.2  9.6  103 B.Elbow - Wrist 10 56 ?49 35.6     A.Elbow ADM 7.3  10.0  104 A.Elbow - B.Elbow 19 61 ?49 34.6  L Ulnar - ADM     Wrist ADM 2.3 ?3.3 7.0 ?6.0 100 Wrist - ADM 7   24.3      B.Elbow ADM 3.9  6.7  95 B.Elbow - Wrist 9.6 61 ?49 23.8     A.Elbow ADM 7.3  6.5  97.7 A.Elbow - B.Elbow 18.6 55 ?49 23.7  R Ulnar - FDI     Wrist FDI 3.1 ?4.5 12.0 ?7.0 100 Wrist - FDI 8   27.3     B.Elbow FDI 4.5  11.4  94.5 B.Elbow - Wrist 10 70 ?49 26.4     A.Elbow FDI 8.0  10.6  93.1 A.Elbow - B.Elbow 17.6 51 ?49 24.8         A.Elbow - Wrist      L Ulnar - FDI     Wrist FDI 2.7 ?4.5 13.9 ?7.0 100 Wrist - FDI 8   31.5     B.Elbow FDI 4.2  15.0  108 B.Elbow - Wrist 9 58 ?49 32.2     A.Elbow FDI 7.3  13.6  90.5 A.Elbow - B.Elbow 16 52 ?49 29.6         A.Elbow - Wrist  SNC    Nerve / Sites Rec. Site Peak Lat Ref.  Amp Ref. Segments Distance Peak Diff Ref.    ms ms V V  cm ms ms  R Radial - Anatomical snuff box (Forearm)     Forearm Wrist 2.4 ?2.9 20 ?15 Forearm - Wrist 10    R Median, Ulnar - Transcarpal comparison     Median Palm Wrist 1.9 ?2.2 35 ?35 Median Palm - Wrist 8       Ulnar Palm Wrist 1.9 ?2.2 16 ?12 Ulnar Palm - Wrist 8          Median Palm - Ulnar Palm  0.0 ?0.4  R Median - Orthodromic (Dig II, Mid palm)     Dig II Wrist 3.0 ?3.4 13 ?10 Dig II - Wrist 13    L Median - Orthodromic (Dig II, Mid palm)     Dig II Wrist 2.9 ?3.4 12 ?10 Dig II - Wrist 13    R Ulnar - Orthodromic, (Dig V, Mid palm)     Dig V Wrist 3.0 ?3.1 7 ?5 Dig V - Wrist 11    L Ulnar - Orthodromic, (Dig V, Mid palm)     Dig V Wrist 2.9 ?3.1 7 ?5 Dig V - Wrist 24                   F  Wave    Nerve F Lat Ref.   ms ms  R Ulnar - ADM 28.5 ?32.0  L Ulnar - ADM 27.6 ?32.0         EMG Summary Table    Spontaneous MUAP Recruitment  Muscle IA Fib PSW Fasc Other Amp Dur. Poly Pattern  R. Deltoid Normal None None None _______ Normal Normal Normal Normal  R. Triceps brachii Normal None None None _______ Normal Normal Normal Normal  R. Biceps brachii Normal None None None _______ Normal Normal Normal Normal  R. Pronator teres Normal None None None _______ Normal Normal Normal  Normal  R. Extensor digitorum communis Normal None None None _______ Normal Normal Normal Normal  R. First dorsal interosseous Normal None None None _______ Normal Normal Normal Normal  R. Cervical paraspinals (low) Normal None None None _______ Normal Normal Normal Normal  R. Opponens pollicis Normal None None None _______ Normal Normal Normal Normal

## 2023-07-16 NOTE — Procedures (Signed)
Full Name: [name] Gender: Nonbinary  MRN #: [id number] Date of Birth: [birth year]    Visit Date: 07/12/2023 15:32 Age: 42 Years Examining Physician: Dr. Naomie Dean Referring Physician: Dr. Naomie Dean Height: 5 feet 7 inch  History: Right arm pain at the elbow and radiating to 4th,5th digits.   Summary: Nerve Conduction Studies were performed on the bilateral upper extremities: The right Ulnar motor study (when recording from the FDI) showed a 30m/s drop across the elbow (N<10). All remaining nerves(as indicated in the following tables) were within normal limits.   EMG was performed on the right upper extremity. All muscles (as indicated in the following tables) were within normal limits.      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    ------------------------------- Naomie Dean , M.D.  [facility] [address] [place], [place] [address] Tel: [phone] Fax: [phone]  Verbal informed consent was obtained from the patient, patient was informed of potential risk of procedure, including bruising, bleeding, hematoma formation, infection, muscle weakness, muscle pain, numbness, among others.        MNC    Nerve / Sites Muscle Latency Ref. Amplitude Ref. Rel Amp Segments Distance Velocity Ref. Area    ms ms mV mV %  cm m/s m/s mVms  R Median - APB     Wrist APB 3.0 ?4.4 9.0 ?4.0 100 Wrist - APB 7   38.7     Upper arm APB 7.2  8.4  93.3 Upper arm - Wrist 24 58 ?49 35.5  L Median - APB     Wrist APB 2.7 ?4.4 5.7 ?4.0 100 Wrist - APB 7   25.9     Upper arm APB 7.2  5.7  100 Upper arm - Wrist 26.6 59 ?49 25.0  R Ulnar - ADM     Wrist ADM 2.4 ?3.3 9.3 ?6.0 100 Wrist - ADM 7   35.8     B.Elbow ADM 4.2  9.6  103 B.Elbow - Wrist 10 56 ?49 35.6     A.Elbow ADM 7.3  10.0  104 A.Elbow - B.Elbow 19 61 ?49 34.6  L Ulnar - ADM     Wrist ADM 2.3 ?3.3 7.0 ?6.0 100 Wrist - ADM 7   24.3      B.Elbow ADM 3.9  6.7  95 B.Elbow - Wrist 9.6 61 ?49 23.8     A.Elbow ADM 7.3  6.5  97.7 A.Elbow - B.Elbow 18.6 55 ?49 23.7  R Ulnar - FDI     Wrist FDI 3.1 ?4.5 12.0 ?7.0 100 Wrist - FDI 8   27.3     B.Elbow FDI 4.5  11.4  94.5 B.Elbow - Wrist 10 70 ?49 26.4     A.Elbow FDI 8.0  10.6  93.1 A.Elbow - B.Elbow 17.6 51 ?49 24.8         A.Elbow - Wrist      L Ulnar - FDI     Wrist FDI 2.7 ?4.5 13.9 ?7.0 100 Wrist - FDI 8   31.5     B.Elbow FDI 4.2  15.0  108 B.Elbow - Wrist 9 58 ?49 32.2     A.Elbow FDI 7.3  13.6  90.5 A.Elbow - B.Elbow 16 52 ?49 29.6         A.Elbow - Wrist  SNC    Nerve / Sites Rec. Site Peak Lat Ref.  Amp Ref. Segments Distance Peak Diff Ref.    ms ms V V  cm ms ms  R Radial - Anatomical snuff box (Forearm)     Forearm Wrist 2.4 ?2.9 20 ?15 Forearm - Wrist 10    R Median, Ulnar - Transcarpal comparison     Median Palm Wrist 1.9 ?2.2 35 ?35 Median Palm - Wrist 8       Ulnar Palm Wrist 1.9 ?2.2 16 ?12 Ulnar Palm - Wrist 8          Median Palm - Ulnar Palm  0.0 ?0.4  R Median - Orthodromic (Dig II, Mid palm)     Dig II Wrist 3.0 ?3.4 13 ?10 Dig II - Wrist 13    L Median - Orthodromic (Dig II, Mid palm)     Dig II Wrist 2.9 ?3.4 12 ?10 Dig II - Wrist 13    R Ulnar - Orthodromic, (Dig V, Mid palm)     Dig V Wrist 3.0 ?3.1 7 ?5 Dig V - Wrist 11    L Ulnar - Orthodromic, (Dig V, Mid palm)     Dig V Wrist 2.9 ?3.1 7 ?5 Dig V - Wrist 24                   F  Wave    Nerve F Lat Ref.   ms ms  R Ulnar - ADM 28.5 ?32.0  L Ulnar - ADM 27.6 ?32.0         EMG Summary Table    Spontaneous MUAP Recruitment  Muscle IA Fib PSW Fasc Other Amp Dur. Poly Pattern  R. Deltoid Normal None None None _______ Normal Normal Normal Normal  R. Triceps brachii Normal None None None _______ Normal Normal Normal Normal  R. Biceps brachii Normal None None None _______ Normal Normal Normal Normal  R. Pronator teres Normal None None None _______ Normal Normal Normal  Normal  R. Extensor digitorum communis Normal None None None _______ Normal Normal Normal Normal  R. First dorsal interosseous Normal None None None _______ Normal Normal Normal Normal  R. Cervical paraspinals (low) Normal None None None _______ Normal Normal Normal Normal  R. Opponens pollicis Normal None None None _______ Normal Normal Normal Normal

## 2023-07-18 ENCOUNTER — Encounter: Payer: Self-pay | Admitting: Neurology

## 2023-08-01 ENCOUNTER — Other Ambulatory Visit: Payer: Self-pay | Admitting: Neurology

## 2023-08-01 DIAGNOSIS — H5319 Other subjective visual disturbances: Secondary | ICD-10-CM

## 2023-08-02 ENCOUNTER — Telehealth: Payer: Self-pay | Admitting: Neurology

## 2023-08-02 NOTE — Telephone Encounter (Signed)
Opthalmology referral faxed to Dr. Kirke Corin at [facility] (fax# [phone], phone# [phone])

## 2023-08-04 LAB — MUSK ANTIBODIES: MuSK Antibodies: <1 U/mL

## 2023-08-04 LAB — MYASTHENIA GRAVIS PROFILE
AChR Binding Ab, Serum: <0.03 nmol/L (ref 0.00–0.24)
AChR-modulating Ab: 10 % (ref 0–45)
Acetylchol Block Ab: 5 % (ref 0–25)
Anti-striation Abs: NEGATIVE

## 2023-08-04 LAB — CREATININE, SERUM
Creatinine, Ser: 0.84 mg/dL (ref 0.76–1.27)
eGFR: 112 mL/min/{1.73_m2} (ref >59)

## 2023-08-04 LAB — BUN: BUN: 9 mg/dL (ref 6–24)

## 2023-08-07 ENCOUNTER — Ambulatory Visit: Payer: BC Managed Care – PPO | Admitting: Neurology

## 2023-08-23 DIAGNOSIS — F411 Generalized anxiety disorder: Secondary | ICD-10-CM | POA: Diagnosis not present

## 2023-08-23 DIAGNOSIS — F4522 Body dysmorphic disorder: Secondary | ICD-10-CM | POA: Diagnosis not present

## 2023-08-23 DIAGNOSIS — F4389 Other reactions to severe stress: Secondary | ICD-10-CM | POA: Diagnosis not present

## 2023-08-23 DIAGNOSIS — F321 Major depressive disorder, single episode, moderate: Secondary | ICD-10-CM | POA: Diagnosis not present

## 2023-08-27 DIAGNOSIS — F321 Major depressive disorder, single episode, moderate: Secondary | ICD-10-CM | POA: Diagnosis not present

## 2023-08-27 DIAGNOSIS — F4389 Other reactions to severe stress: Secondary | ICD-10-CM | POA: Diagnosis not present

## 2023-08-27 DIAGNOSIS — F4522 Body dysmorphic disorder: Secondary | ICD-10-CM | POA: Diagnosis not present

## 2023-08-27 DIAGNOSIS — F411 Generalized anxiety disorder: Secondary | ICD-10-CM | POA: Diagnosis not present

## 2023-08-30 DIAGNOSIS — F4522 Body dysmorphic disorder: Secondary | ICD-10-CM | POA: Diagnosis not present

## 2023-08-30 DIAGNOSIS — F321 Major depressive disorder, single episode, moderate: Secondary | ICD-10-CM | POA: Diagnosis not present

## 2023-08-30 DIAGNOSIS — F411 Generalized anxiety disorder: Secondary | ICD-10-CM | POA: Diagnosis not present

## 2023-08-30 DIAGNOSIS — F4389 Other reactions to severe stress: Secondary | ICD-10-CM | POA: Diagnosis not present

## 2023-09-02 ENCOUNTER — Encounter: Payer: Self-pay | Admitting: Neurology

## 2023-09-03 DIAGNOSIS — M4003 Postural kyphosis, cervicothoracic region: Secondary | ICD-10-CM | POA: Diagnosis not present

## 2023-09-03 DIAGNOSIS — G4486 Cervicogenic headache: Secondary | ICD-10-CM | POA: Diagnosis not present

## 2023-09-03 DIAGNOSIS — M5137 Other intervertebral disc degeneration, lumbosacral region with discogenic back pain only: Secondary | ICD-10-CM | POA: Diagnosis not present

## 2023-09-03 DIAGNOSIS — M9904 Segmental and somatic dysfunction of sacral region: Secondary | ICD-10-CM | POA: Diagnosis not present

## 2023-09-03 DIAGNOSIS — M488X2 Other specified spondylopathies, cervical region: Secondary | ICD-10-CM | POA: Diagnosis not present

## 2023-09-03 DIAGNOSIS — M9902 Segmental and somatic dysfunction of thoracic region: Secondary | ICD-10-CM | POA: Diagnosis not present

## 2023-09-06 DIAGNOSIS — F4389 Other reactions to severe stress: Secondary | ICD-10-CM | POA: Diagnosis not present

## 2023-09-06 DIAGNOSIS — F411 Generalized anxiety disorder: Secondary | ICD-10-CM | POA: Diagnosis not present

## 2023-09-06 DIAGNOSIS — F4522 Body dysmorphic disorder: Secondary | ICD-10-CM | POA: Diagnosis not present

## 2023-09-06 DIAGNOSIS — F321 Major depressive disorder, single episode, moderate: Secondary | ICD-10-CM | POA: Diagnosis not present

## 2023-09-10 ENCOUNTER — Encounter: Payer: Self-pay | Admitting: Neurology

## 2023-09-10 DIAGNOSIS — F321 Major depressive disorder, single episode, moderate: Secondary | ICD-10-CM | POA: Diagnosis not present

## 2023-09-10 DIAGNOSIS — F4522 Body dysmorphic disorder: Secondary | ICD-10-CM | POA: Diagnosis not present

## 2023-09-10 DIAGNOSIS — H5319 Other subjective visual disturbances: Secondary | ICD-10-CM

## 2023-09-10 DIAGNOSIS — F4389 Other reactions to severe stress: Secondary | ICD-10-CM | POA: Diagnosis not present

## 2023-09-10 DIAGNOSIS — F411 Generalized anxiety disorder: Secondary | ICD-10-CM | POA: Diagnosis not present

## 2023-09-11 ENCOUNTER — Telehealth: Payer: Self-pay | Admitting: Neurology

## 2023-09-11 NOTE — Telephone Encounter (Signed)
Referral for ophthalmology fax to [facility]. Phone: [phone], Fax: [phone]

## 2023-09-13 ENCOUNTER — Other Ambulatory Visit: Payer: Self-pay | Admitting: Neurology

## 2023-09-13 DIAGNOSIS — F321 Major depressive disorder, single episode, moderate: Secondary | ICD-10-CM | POA: Diagnosis not present

## 2023-09-13 DIAGNOSIS — R42 Dizziness and giddiness: Secondary | ICD-10-CM

## 2023-09-13 DIAGNOSIS — H8309 Labyrinthitis, unspecified ear: Secondary | ICD-10-CM

## 2023-09-13 DIAGNOSIS — F411 Generalized anxiety disorder: Secondary | ICD-10-CM | POA: Diagnosis not present

## 2023-09-13 DIAGNOSIS — F4389 Other reactions to severe stress: Secondary | ICD-10-CM | POA: Diagnosis not present

## 2023-09-13 DIAGNOSIS — F4522 Body dysmorphic disorder: Secondary | ICD-10-CM | POA: Diagnosis not present

## 2023-09-13 MED ORDER — CLONAZEPAM 0.25 MG PO TBDP
0.2500 mg | ORAL_TABLET | Freq: Two times a day (BID) | ORAL | 1 refills | Status: DC
Start: 2023-09-13 — End: 2023-11-15

## 2023-09-13 MED ORDER — PREDNISONE 20 MG PO TABS: ORAL_TABLET | ORAL | 3 refills | Status: DC

## 2023-09-17 DIAGNOSIS — M9902 Segmental and somatic dysfunction of thoracic region: Secondary | ICD-10-CM | POA: Diagnosis not present

## 2023-09-17 DIAGNOSIS — M488X2 Other specified spondylopathies, cervical region: Secondary | ICD-10-CM | POA: Diagnosis not present

## 2023-09-17 DIAGNOSIS — G4486 Cervicogenic headache: Secondary | ICD-10-CM | POA: Diagnosis not present

## 2023-09-17 DIAGNOSIS — M9904 Segmental and somatic dysfunction of sacral region: Secondary | ICD-10-CM | POA: Diagnosis not present

## 2023-09-17 DIAGNOSIS — M4003 Postural kyphosis, cervicothoracic region: Secondary | ICD-10-CM | POA: Diagnosis not present

## 2023-09-18 DIAGNOSIS — F321 Major depressive disorder, single episode, moderate: Secondary | ICD-10-CM | POA: Diagnosis not present

## 2023-09-18 DIAGNOSIS — F411 Generalized anxiety disorder: Secondary | ICD-10-CM | POA: Diagnosis not present

## 2023-09-18 DIAGNOSIS — F4389 Other reactions to severe stress: Secondary | ICD-10-CM | POA: Diagnosis not present

## 2023-09-18 DIAGNOSIS — F4522 Body dysmorphic disorder: Secondary | ICD-10-CM | POA: Diagnosis not present

## 2023-09-20 DIAGNOSIS — F4389 Other reactions to severe stress: Secondary | ICD-10-CM | POA: Diagnosis not present

## 2023-09-24 DIAGNOSIS — F4389 Other reactions to severe stress: Secondary | ICD-10-CM | POA: Diagnosis not present

## 2023-09-27 DIAGNOSIS — F4389 Other reactions to severe stress: Secondary | ICD-10-CM | POA: Diagnosis not present

## 2023-10-01 DIAGNOSIS — F4389 Other reactions to severe stress: Secondary | ICD-10-CM | POA: Diagnosis not present

## 2023-10-04 DIAGNOSIS — F4389 Other reactions to severe stress: Secondary | ICD-10-CM | POA: Diagnosis not present

## 2023-10-08 DIAGNOSIS — F4389 Other reactions to severe stress: Secondary | ICD-10-CM | POA: Diagnosis not present

## 2023-10-11 DIAGNOSIS — F4389 Other reactions to severe stress: Secondary | ICD-10-CM | POA: Diagnosis not present

## 2023-10-12 DIAGNOSIS — F339 Major depressive disorder, recurrent, unspecified: Secondary | ICD-10-CM | POA: Diagnosis not present

## 2023-10-12 DIAGNOSIS — F419 Anxiety disorder, unspecified: Secondary | ICD-10-CM | POA: Diagnosis not present

## 2023-10-15 ENCOUNTER — Encounter: Payer: Self-pay | Admitting: Neurology

## 2023-10-15 DIAGNOSIS — M488X2 Other specified spondylopathies, cervical region: Secondary | ICD-10-CM | POA: Diagnosis not present

## 2023-10-15 DIAGNOSIS — M9904 Segmental and somatic dysfunction of sacral region: Secondary | ICD-10-CM | POA: Diagnosis not present

## 2023-10-15 DIAGNOSIS — G4486 Cervicogenic headache: Secondary | ICD-10-CM | POA: Diagnosis not present

## 2023-10-15 DIAGNOSIS — M4003 Postural kyphosis, cervicothoracic region: Secondary | ICD-10-CM | POA: Diagnosis not present

## 2023-10-15 DIAGNOSIS — F4389 Other reactions to severe stress: Secondary | ICD-10-CM | POA: Diagnosis not present

## 2023-10-15 DIAGNOSIS — M9902 Segmental and somatic dysfunction of thoracic region: Secondary | ICD-10-CM | POA: Diagnosis not present

## 2023-10-17 DIAGNOSIS — F4389 Other reactions to severe stress: Secondary | ICD-10-CM | POA: Diagnosis not present

## 2023-10-22 DIAGNOSIS — F4389 Other reactions to severe stress: Secondary | ICD-10-CM | POA: Diagnosis not present

## 2023-10-25 DIAGNOSIS — F4389 Other reactions to severe stress: Secondary | ICD-10-CM | POA: Diagnosis not present

## 2023-10-29 DIAGNOSIS — G4486 Cervicogenic headache: Secondary | ICD-10-CM | POA: Diagnosis not present

## 2023-10-29 DIAGNOSIS — M5137 Other intervertebral disc degeneration, lumbosacral region with discogenic back pain only: Secondary | ICD-10-CM | POA: Diagnosis not present

## 2023-10-29 DIAGNOSIS — F4389 Other reactions to severe stress: Secondary | ICD-10-CM | POA: Diagnosis not present

## 2023-10-29 DIAGNOSIS — M9902 Segmental and somatic dysfunction of thoracic region: Secondary | ICD-10-CM | POA: Diagnosis not present

## 2023-10-29 DIAGNOSIS — M4003 Postural kyphosis, cervicothoracic region: Secondary | ICD-10-CM | POA: Diagnosis not present

## 2023-10-29 DIAGNOSIS — M488X2 Other specified spondylopathies, cervical region: Secondary | ICD-10-CM | POA: Diagnosis not present

## 2023-10-29 DIAGNOSIS — M9904 Segmental and somatic dysfunction of sacral region: Secondary | ICD-10-CM | POA: Diagnosis not present

## 2023-11-01 DIAGNOSIS — F4389 Other reactions to severe stress: Secondary | ICD-10-CM | POA: Diagnosis not present

## 2023-11-05 DIAGNOSIS — F4389 Other reactions to severe stress: Secondary | ICD-10-CM | POA: Diagnosis not present

## 2023-11-08 DIAGNOSIS — F4389 Other reactions to severe stress: Secondary | ICD-10-CM | POA: Diagnosis not present

## 2023-11-12 DIAGNOSIS — F4389 Other reactions to severe stress: Secondary | ICD-10-CM | POA: Diagnosis not present

## 2023-11-13 ENCOUNTER — Other Ambulatory Visit: Payer: Self-pay | Admitting: Neurology

## 2023-11-13 DIAGNOSIS — R42 Dizziness and giddiness: Secondary | ICD-10-CM

## 2023-11-15 ENCOUNTER — Telehealth: Payer: Self-pay

## 2023-11-15 ENCOUNTER — Encounter: Payer: Self-pay | Admitting: Neurology

## 2023-11-15 ENCOUNTER — Other Ambulatory Visit (HOSPITAL_COMMUNITY): Payer: Self-pay

## 2023-11-15 ENCOUNTER — Other Ambulatory Visit: Payer: Self-pay

## 2023-11-15 NOTE — Telephone Encounter (Signed)
Pt Last Seen 07/12/2023 Upcoming appointment 12/10/2023  Klonopin Last Filled 03/06/2023 Pt has refill in pharmacy that hasn't been filled yet

## 2023-11-15 NOTE — Telephone Encounter (Signed)
Received a request from [facility] to do PA for Ajovy-However could not verify insurance with the eligibility tracker, Also there was a PA in [facility] but when I submit it states the patients zip code and/or phone number do not match the insurances records. Can we have PT upload a new copy of her new insurance plan (Pharmacy benefit plan) into MyCHart. Also please have PT verify which address insurance has because what we have in Epic is not what insurance is saying the PT has. Also looks like insurance is elapsed per epic. Thanks.

## 2023-11-19 ENCOUNTER — Other Ambulatory Visit (HOSPITAL_COMMUNITY): Payer: Self-pay

## 2023-11-19 ENCOUNTER — Encounter: Payer: Self-pay | Admitting: Neurology

## 2023-11-19 NOTE — Telephone Encounter (Signed)
Pharmacy Patient Advocate Encounter  Received notification from [facility] that Prior Authorization for AJOVY (fremanezumab-vfrm) injection 225MG /1.5ML auto-injectors has been APPROVED from 10/20/2023 to 11/18/2024. Ran test claim, Copay is $24.98 per 90DS. This test claim was processed through [facility]- copay amounts may vary at other pharmacies due to pharmacy/plan contracts, or as the patient moves through the different stages of their insurance plan.   PA #/Case ID/Reference #: PA Case ID #: [id number]

## 2023-11-19 NOTE — Telephone Encounter (Signed)
Pharmacy Patient Advocate Encounter   Received notification from Physician's Office that prior authorization for AJOVY (fremanezumab-vfrm) injection 225MG /1.5ML auto-injectors is required/requested.   Insurance verification completed.   The patient is insured through [employer] .   Per test claim: PA required; PA submitted to above mentioned insurance via CoverMyMeds Key/confirmation #/EOC [id number] Status is pending

## 2023-11-22 DIAGNOSIS — F4389 Other reactions to severe stress: Secondary | ICD-10-CM | POA: Diagnosis not present

## 2023-11-26 DIAGNOSIS — F4389 Other reactions to severe stress: Secondary | ICD-10-CM | POA: Diagnosis not present

## 2023-11-29 DIAGNOSIS — F4522 Body dysmorphic disorder: Secondary | ICD-10-CM | POA: Diagnosis not present

## 2023-11-29 DIAGNOSIS — F321 Major depressive disorder, single episode, moderate: Secondary | ICD-10-CM | POA: Diagnosis not present

## 2023-11-29 DIAGNOSIS — F4389 Other reactions to severe stress: Secondary | ICD-10-CM | POA: Diagnosis not present

## 2023-11-29 DIAGNOSIS — F411 Generalized anxiety disorder: Secondary | ICD-10-CM | POA: Diagnosis not present

## 2023-12-03 DIAGNOSIS — F4389 Other reactions to severe stress: Secondary | ICD-10-CM | POA: Diagnosis not present

## 2023-12-06 DIAGNOSIS — F4389 Other reactions to severe stress: Secondary | ICD-10-CM | POA: Diagnosis not present

## 2023-12-10 ENCOUNTER — Encounter: Payer: Self-pay | Admitting: Neurology

## 2023-12-10 ENCOUNTER — Encounter: Payer: Self-pay | Admitting: *Deleted

## 2023-12-10 ENCOUNTER — Ambulatory Visit: Payer: BLUE CROSS/BLUE SHIELD | Admitting: Neurology

## 2023-12-10 ENCOUNTER — Telehealth: Payer: Self-pay | Admitting: *Deleted

## 2023-12-10 VITALS — BP 122/79 | HR 73 | Ht 67.5 in | Wt 245.0 lb

## 2023-12-10 DIAGNOSIS — H5500 Unspecified nystagmus: Secondary | ICD-10-CM

## 2023-12-10 DIAGNOSIS — F4389 Other reactions to severe stress: Secondary | ICD-10-CM | POA: Diagnosis not present

## 2023-12-10 DIAGNOSIS — F4522 Body dysmorphic disorder: Secondary | ICD-10-CM | POA: Diagnosis not present

## 2023-12-10 DIAGNOSIS — R42 Dizziness and giddiness: Secondary | ICD-10-CM

## 2023-12-10 DIAGNOSIS — F321 Major depressive disorder, single episode, moderate: Secondary | ICD-10-CM | POA: Diagnosis not present

## 2023-12-10 DIAGNOSIS — F411 Generalized anxiety disorder: Secondary | ICD-10-CM | POA: Diagnosis not present

## 2023-12-10 DIAGNOSIS — G43009 Migraine without aura, not intractable, without status migrainosus: Secondary | ICD-10-CM | POA: Diagnosis not present

## 2023-12-10 MED ORDER — NURTEC 75 MG PO TBDP: 75.0000 mg | ORAL_TABLET | Freq: Every day | ORAL | 11 refills | Status: DC | PRN

## 2023-12-10 MED ORDER — KETOROLAC TROMETHAMINE 60 MG/2ML IM SOLN: INTRAMUSCULAR | 11 refills | Status: AC

## 2023-12-10 MED ORDER — ONDANSETRON 4 MG PO TBDP: 4.0000 mg | ORAL_TABLET | Freq: Three times a day (TID) | ORAL | 3 refills | Status: AC | PRN

## 2023-12-10 MED ORDER — PROMETHAZINE HCL 25 MG PO TABS: 25.0000 mg | ORAL_TABLET | Freq: Four times a day (QID) | ORAL | 3 refills | Status: AC | PRN

## 2023-12-10 MED ORDER — BD SAFETYGLIDE SYRINGE/NEEDLE 25G X 1" 3 ML MISC: 11 refills | Status: AC

## 2023-12-10 MED ORDER — BD SAFETYGLIDE SYRINGE/NEEDLE 25G X 1" 3 ML MISC: 5 refills | Status: DC

## 2023-12-10 MED ORDER — CLONAZEPAM 0.25 MG PO TBDP: 0.2500 mg | ORAL_TABLET | Freq: Three times a day (TID) | ORAL | 4 refills | Status: DC | PRN

## 2023-12-10 MED ORDER — NURTEC 75 MG PO TBDP
75.0000 mg | ORAL_TABLET | Freq: Every day | ORAL | 11 refills | Status: DC | PRN
Start: 2023-12-10 — End: 2023-12-10

## 2023-12-10 NOTE — Patient Instructions (Addendum)
ENT: [facility] [place] [phone]  Nurtec refill: PLEASE USE COPAY CARD: BIN [id number] PCN CN GRP [id number] ID [id number] EXP 11/19/2024  Meds ordered this encounter  Medications   DISCONTD: Rimegepant Sulfate (NURTEC) 75 MG TBDP    Sig: Take 1 tablet (75 mg total) by mouth daily as needed. For migraines. Take as close to onset of migraine as possible. One daily maximum. PLEASE USE COPAY CARD: BIN [id number] PCN CN GRP [id number] ID [id number] EXP 11/19/2024    Dispense:  16 tablet    Refill:  11    PLEASE USE COPAY CARD: BIN [id number] PCN CN GRP [id number] ID [id number] EXP 11/19/2024   clonazePAM (KLONOPIN) 0.25 MG disintegrating tablet    Sig: Take 1 tablet (0.25 mg total) by mouth 3 (three) times daily as needed for seizure.    Dispense:  60 tablet    Refill:  4    Not to exceed 4 additional fills before 03/11/2024   ketorolac (TORADOL) 60 MG/2ML SOLN injection    Sig: Inject 1-54ml (30-60mg ) intramuscularly at onset of migraine. May repeat in 6 hours. Max twice a day and 4 days per month.    Dispense:  10 mL    Refill:  11   SYRINGE-NEEDLE, DISP, 3 ML (BD SAFETYGLIDE SYRINGE/NEEDLE) 25G X 1" 3 ML MISC    Sig: Attach needle to syringe and use to draw up and administer Toradol. Do not reuse.    Dispense:  4 each    Refill:  5   ondansetron (ZOFRAN-ODT) 4 MG disintegrating tablet    Sig: Take 1 tablet (4 mg total) by mouth every 8 (eight) hours as needed for nausea.    Dispense:  90 tablet    Refill:  3   promethazine (PHENERGAN) 25 MG tablet    Sig: Take 1 tablet (25 mg total) by mouth every 6 (six) hours as needed for nausea or vomiting.    Dispense:  90 tablet    Refill:  3   Rimegepant Sulfate (NURTEC) 75 MG TBDP    Sig: Take 1 tablet (75 mg total) by mouth daily as needed. For migraines. Take as close to onset of migraine as possible. One daily maximum. PLEASE USE COPAY CARD: BIN [id number] PCN CN GRP [id number] ID [id number] EXP 11/19/2024     Dispense:  16 tablet    Refill:  11    PLEASE USE COPAY CARD: BIN [id number] PCN CN GRP [id number] ID [id number] EXP 11/19/2024     Nystagmus to the left with worsening on head turning to the left" indicates a condition where the rapid, involuntary eye movements (nystagmus) are directed towards the left, and this movement becomes significantly worse when the head is turned to the left, typically signifying a problem with the peripheral vestibular system on the left side, like vestibular neuritis, which is often associated with dizziness and vertigo.   Key points to understand: Direction of nystagmus: "Left-beating" nystagmus means the rapid phase of the eye movement is towards the left.  Worsening with left head turn: This is a key feature of a peripheral vestibular lesion on the left side, as per Alexander's Law, where nystagmus worsens when looking towards the side of the affected vestibular nerve.  Possible causes: Vestibular neuritis: Inflammation of the vestibular nerve, often causing sudden onset vertigo and directional nystagmus.  Meniere's disease: Inner ear disorder with fluctuating symptoms including vertigo, tinnitus, hearing loss, and sometimes nystagmus.  Benign paroxysmal positional vertigo (BPPV): A type  of vertigo triggered by specific head positions, usually with a characteristic pattern of nystagmus   SEND TO ENT FOR TESTING SUCH AS THE FOLLOWING:  Videonystagmography (VNG) Email this page to a friend Print Facebook X Pinterest What is videonystagmography (VNG)? Videonystagmography (VNG) is a test that measures a type of eye movement that you can't control. These eye movements are called nystagmus. With nystagmus, your eyes move from side to side, up and down, or in a circle. The movements can be slow or fast, steady or jerky.  Normally, nystagmus happens briefly as your eyes adjust when you move your head into certain positions. If it happens at other times or doesn't  happen when it should, you may have a disorder of the balance system in your inner ear. This balance system is called the vestibular system.  Good balance partly depends on the organs and nerves in your vestibular system working together with your vision and the muscles in your neck and eyes. When everything is working properly, your brain gets the information it needs to keep your eyes focused on an object when your body changes position. This helps you keep your balance when you are moving.  Problems with certain parts of your balance system can cause nystagmus, which may make you feel dizzy or unsteady. If you're having symptoms of a balance problem, VNG can help find out if a vestibular disorder is causing your symptoms.  Other names: VNG  What is it used for? VNG is used to find out if you have a disorder of the vestibular system (the balance system in your inner ear). It can also help find problems with the nerves or parts of your brain that are part of your sense of balance.  Why do I need a VNG? You may need a VNG if you have symptoms of a vestibular disorder. The main symptom is dizziness. Dizziness means different things to different people. It can include:  Vertigo, a feeling that you or everything around you is spinning Feeling as if you're going to fall Feeling lightheaded or as if you are going to faint Other symptoms of a vestibular disorder may include:  Ringing in your ears (tinnitus) Feeling of fullness or pressure in your ear What happens during a VNG? A VNG is usually done by a specialist, such as:  An audiologist, a health care provider who is trained to diagnose hearing loss and balance disorders. Audiologists can also provide certain treatments to improve these conditions. An otolaryngologist (ENT), a doctor who specializes in treating diseases of the ears, nose, throat, and head and neck. A neurologist, a doctor who specializes in diagnosing and treating disorders of  the brain and nervous system During a VNG test, you'll sit in a dark room wearing special goggles. A camera in the goggles will record your eye movements during these three main parts of the test:  Ocular testing. Without moving your head, you will follow moving lights with your eyes and stare at steady lights. Positional testing. You'll be asked to move your head and body into different positions. The provider will also move your head into certain positions. Your eyes will be checked to see if certain movements cause nystagmus. Caloric testing. For this part of the VNG, your ears will be tested one at a time. This can show if the vestibular system in one ear is working better than the other. Cool water or air will be put into your ear. The cool temperature should make your eyes  move in specific ways. You will be tested again with warm water or air in the same ear. If your eyes don't move as expected, it may mean you have damage in your inner ear or the part of your brain that controls balance.
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
## 2023-12-10 NOTE — Telephone Encounter (Signed)
Needs PA for Nurtec

## 2023-12-11 ENCOUNTER — Other Ambulatory Visit (HOSPITAL_COMMUNITY): Payer: Self-pay

## 2023-12-11 ENCOUNTER — Telehealth: Payer: Self-pay

## 2023-12-11 DIAGNOSIS — G43009 Migraine without aura, not intractable, without status migrainosus: Secondary | ICD-10-CM

## 2023-12-11 NOTE — Telephone Encounter (Signed)
Pharmacy Patient Advocate Encounter   Received notification from Physician's Office that prior authorization for Nurtec 75MG  dispersible tablets is required/requested.   Insurance verification completed.   The patient is insured through [employer] .   Per test claim: PA required; PA submitted to above mentioned insurance via CoverMyMeds Key/confirmation #/EOC [id number] Status is pending

## 2023-12-11 NOTE — Telephone Encounter (Signed)
See other phone message  

## 2023-12-11 NOTE — Telephone Encounter (Signed)
Pharmacy Patient Advocate Encounter  Received notification from [facility] that Prior Authorization for Nurtec 75MG  dispersible tablets has been APPROVED from 11/11/2023 to 12/10/2024. Ran test claim, Copay is $0. This test claim was processed through [facility]- copay amounts may vary at other pharmacies due to pharmacy/plan contracts, or as the patient moves through the different stages of their insurance plan.   PA #/Case ID/Reference #: PA Case ID #: [id number]

## 2023-12-12 MED ORDER — NURTEC 75 MG PO TBDP: 75.0000 mg | ORAL_TABLET | Freq: Every day | ORAL | 11 refills | Status: AC | PRN

## 2023-12-12 NOTE — Addendum Note (Signed)
Addended by: Bertram Savin on: 12/12/2023 07:58 AM   Modules accepted: Orders

## 2023-12-12 NOTE — Telephone Encounter (Signed)
Nurtec sent to [facility] per pt request

## 2023-12-13 DIAGNOSIS — F411 Generalized anxiety disorder: Secondary | ICD-10-CM | POA: Diagnosis not present

## 2023-12-13 DIAGNOSIS — F4389 Other reactions to severe stress: Secondary | ICD-10-CM | POA: Diagnosis not present

## 2023-12-13 DIAGNOSIS — F321 Major depressive disorder, single episode, moderate: Secondary | ICD-10-CM | POA: Diagnosis not present

## 2023-12-13 DIAGNOSIS — F4522 Body dysmorphic disorder: Secondary | ICD-10-CM | POA: Diagnosis not present

## 2023-12-14 ENCOUNTER — Encounter: Payer: Self-pay | Admitting: Neurology

## 2023-12-14 DIAGNOSIS — H5500 Unspecified nystagmus: Secondary | ICD-10-CM

## 2023-12-14 DIAGNOSIS — R42 Dizziness and giddiness: Secondary | ICD-10-CM

## 2023-12-17 ENCOUNTER — Telehealth: Payer: Self-pay | Admitting: Neurology

## 2023-12-17 DIAGNOSIS — F4389 Other reactions to severe stress: Secondary | ICD-10-CM | POA: Diagnosis not present

## 2023-12-17 DIAGNOSIS — F4522 Body dysmorphic disorder: Secondary | ICD-10-CM | POA: Diagnosis not present

## 2023-12-17 DIAGNOSIS — F411 Generalized anxiety disorder: Secondary | ICD-10-CM | POA: Diagnosis not present

## 2023-12-17 DIAGNOSIS — F321 Major depressive disorder, single episode, moderate: Secondary | ICD-10-CM | POA: Diagnosis not present

## 2023-12-17 NOTE — Telephone Encounter (Signed)
Referral for ENT fax to [facility]. Phone: [phone], Fax: [phone]

## 2023-12-18 DIAGNOSIS — M4003 Postural kyphosis, cervicothoracic region: Secondary | ICD-10-CM | POA: Diagnosis not present

## 2023-12-18 DIAGNOSIS — G4486 Cervicogenic headache: Secondary | ICD-10-CM | POA: Diagnosis not present

## 2023-12-18 DIAGNOSIS — M9902 Segmental and somatic dysfunction of thoracic region: Secondary | ICD-10-CM | POA: Diagnosis not present

## 2023-12-18 DIAGNOSIS — M488X2 Other specified spondylopathies, cervical region: Secondary | ICD-10-CM | POA: Diagnosis not present

## 2023-12-18 DIAGNOSIS — M9904 Segmental and somatic dysfunction of sacral region: Secondary | ICD-10-CM | POA: Diagnosis not present

## 2023-12-20 DIAGNOSIS — F321 Major depressive disorder, single episode, moderate: Secondary | ICD-10-CM | POA: Diagnosis not present

## 2023-12-20 DIAGNOSIS — F4522 Body dysmorphic disorder: Secondary | ICD-10-CM | POA: Diagnosis not present

## 2023-12-20 DIAGNOSIS — F4389 Other reactions to severe stress: Secondary | ICD-10-CM | POA: Diagnosis not present

## 2023-12-20 DIAGNOSIS — F411 Generalized anxiety disorder: Secondary | ICD-10-CM | POA: Diagnosis not present

## 2023-12-20 NOTE — Addendum Note (Signed)
Addended by: Bertram Savin on: 12/20/2023 12:03 PM   Modules accepted: Orders

## 2023-12-20 NOTE — Telephone Encounter (Signed)
ENT referral order placed to [facility] ENT Dr Joanne Gavel

## 2023-12-24 DIAGNOSIS — F411 Generalized anxiety disorder: Secondary | ICD-10-CM | POA: Diagnosis not present

## 2023-12-24 DIAGNOSIS — F4522 Body dysmorphic disorder: Secondary | ICD-10-CM | POA: Diagnosis not present

## 2023-12-24 DIAGNOSIS — F321 Major depressive disorder, single episode, moderate: Secondary | ICD-10-CM | POA: Diagnosis not present

## 2023-12-24 DIAGNOSIS — F4389 Other reactions to severe stress: Secondary | ICD-10-CM | POA: Diagnosis not present

## 2023-12-24 NOTE — Telephone Encounter (Signed)
Pt requested referral for ENT resent to [facility] to see Dr. Joanne Gavel. Phone: [phone], Fax: [phone].

## 2023-12-27 DIAGNOSIS — F411 Generalized anxiety disorder: Secondary | ICD-10-CM | POA: Diagnosis not present

## 2023-12-27 DIAGNOSIS — F4389 Other reactions to severe stress: Secondary | ICD-10-CM | POA: Diagnosis not present

## 2023-12-27 DIAGNOSIS — F4522 Body dysmorphic disorder: Secondary | ICD-10-CM | POA: Diagnosis not present

## 2023-12-27 DIAGNOSIS — F321 Major depressive disorder, single episode, moderate: Secondary | ICD-10-CM | POA: Diagnosis not present

## 2023-12-31 DIAGNOSIS — F4389 Other reactions to severe stress: Secondary | ICD-10-CM | POA: Diagnosis not present

## 2023-12-31 DIAGNOSIS — F411 Generalized anxiety disorder: Secondary | ICD-10-CM | POA: Diagnosis not present

## 2023-12-31 DIAGNOSIS — F4522 Body dysmorphic disorder: Secondary | ICD-10-CM | POA: Diagnosis not present

## 2023-12-31 DIAGNOSIS — F321 Major depressive disorder, single episode, moderate: Secondary | ICD-10-CM | POA: Diagnosis not present

## 2024-01-03 DIAGNOSIS — F411 Generalized anxiety disorder: Secondary | ICD-10-CM | POA: Diagnosis not present

## 2024-01-03 DIAGNOSIS — F4522 Body dysmorphic disorder: Secondary | ICD-10-CM | POA: Diagnosis not present

## 2024-01-03 DIAGNOSIS — F4389 Other reactions to severe stress: Secondary | ICD-10-CM | POA: Diagnosis not present

## 2024-01-03 DIAGNOSIS — F321 Major depressive disorder, single episode, moderate: Secondary | ICD-10-CM | POA: Diagnosis not present

## 2024-01-07 DIAGNOSIS — F4522 Body dysmorphic disorder: Secondary | ICD-10-CM | POA: Diagnosis not present

## 2024-01-07 DIAGNOSIS — F321 Major depressive disorder, single episode, moderate: Secondary | ICD-10-CM | POA: Diagnosis not present

## 2024-01-07 DIAGNOSIS — F411 Generalized anxiety disorder: Secondary | ICD-10-CM | POA: Diagnosis not present

## 2024-01-07 DIAGNOSIS — F4389 Other reactions to severe stress: Secondary | ICD-10-CM | POA: Diagnosis not present

## 2024-01-09 DIAGNOSIS — G4700 Insomnia, unspecified: Secondary | ICD-10-CM | POA: Diagnosis not present

## 2024-01-09 DIAGNOSIS — F515 Nightmare disorder: Secondary | ICD-10-CM | POA: Diagnosis not present

## 2024-01-09 DIAGNOSIS — G4733 Obstructive sleep apnea (adult) (pediatric): Secondary | ICD-10-CM | POA: Diagnosis not present

## 2024-01-09 DIAGNOSIS — G478 Other sleep disorders: Secondary | ICD-10-CM | POA: Diagnosis not present

## 2024-01-10 DIAGNOSIS — F4389 Other reactions to severe stress: Secondary | ICD-10-CM | POA: Diagnosis not present

## 2024-01-10 DIAGNOSIS — F321 Major depressive disorder, single episode, moderate: Secondary | ICD-10-CM | POA: Diagnosis not present

## 2024-01-10 DIAGNOSIS — F411 Generalized anxiety disorder: Secondary | ICD-10-CM | POA: Diagnosis not present

## 2024-01-10 DIAGNOSIS — F4522 Body dysmorphic disorder: Secondary | ICD-10-CM | POA: Diagnosis not present

## 2024-01-14 DIAGNOSIS — F321 Major depressive disorder, single episode, moderate: Secondary | ICD-10-CM | POA: Diagnosis not present

## 2024-01-14 DIAGNOSIS — F4389 Other reactions to severe stress: Secondary | ICD-10-CM | POA: Diagnosis not present

## 2024-01-14 DIAGNOSIS — F411 Generalized anxiety disorder: Secondary | ICD-10-CM | POA: Diagnosis not present

## 2024-01-14 DIAGNOSIS — F4522 Body dysmorphic disorder: Secondary | ICD-10-CM | POA: Diagnosis not present

## 2024-01-18 DIAGNOSIS — F331 Major depressive disorder, recurrent, moderate: Secondary | ICD-10-CM | POA: Diagnosis not present

## 2024-01-18 DIAGNOSIS — F4389 Other reactions to severe stress: Secondary | ICD-10-CM | POA: Diagnosis not present

## 2024-01-18 DIAGNOSIS — F321 Major depressive disorder, single episode, moderate: Secondary | ICD-10-CM | POA: Diagnosis not present

## 2024-01-18 DIAGNOSIS — F4522 Body dysmorphic disorder: Secondary | ICD-10-CM | POA: Diagnosis not present

## 2024-01-18 DIAGNOSIS — F411 Generalized anxiety disorder: Secondary | ICD-10-CM | POA: Diagnosis not present

## 2024-01-20 ENCOUNTER — Encounter: Payer: Self-pay | Admitting: Neurology

## 2024-01-21 DIAGNOSIS — F321 Major depressive disorder, single episode, moderate: Secondary | ICD-10-CM | POA: Diagnosis not present

## 2024-01-21 DIAGNOSIS — F4389 Other reactions to severe stress: Secondary | ICD-10-CM | POA: Diagnosis not present

## 2024-01-21 DIAGNOSIS — F4522 Body dysmorphic disorder: Secondary | ICD-10-CM | POA: Diagnosis not present

## 2024-01-21 DIAGNOSIS — F411 Generalized anxiety disorder: Secondary | ICD-10-CM | POA: Diagnosis not present

## 2024-01-24 DIAGNOSIS — F4522 Body dysmorphic disorder: Secondary | ICD-10-CM | POA: Diagnosis not present

## 2024-01-24 DIAGNOSIS — F411 Generalized anxiety disorder: Secondary | ICD-10-CM | POA: Diagnosis not present

## 2024-01-24 DIAGNOSIS — F321 Major depressive disorder, single episode, moderate: Secondary | ICD-10-CM | POA: Diagnosis not present

## 2024-01-24 DIAGNOSIS — F4389 Other reactions to severe stress: Secondary | ICD-10-CM | POA: Diagnosis not present

## 2024-01-28 DIAGNOSIS — F4389 Other reactions to severe stress: Secondary | ICD-10-CM | POA: Diagnosis not present

## 2024-01-28 DIAGNOSIS — F321 Major depressive disorder, single episode, moderate: Secondary | ICD-10-CM | POA: Diagnosis not present

## 2024-01-28 DIAGNOSIS — F4522 Body dysmorphic disorder: Secondary | ICD-10-CM | POA: Diagnosis not present

## 2024-01-28 DIAGNOSIS — F411 Generalized anxiety disorder: Secondary | ICD-10-CM | POA: Diagnosis not present

## 2024-01-30 ENCOUNTER — Other Ambulatory Visit: Payer: Self-pay | Admitting: Neurology

## 2024-01-30 DIAGNOSIS — S0990XA Unspecified injury of head, initial encounter: Secondary | ICD-10-CM

## 2024-01-30 DIAGNOSIS — R519 Headache, unspecified: Secondary | ICD-10-CM

## 2024-01-30 DIAGNOSIS — S060XAA Concussion with loss of consciousness status unknown, initial encounter: Secondary | ICD-10-CM

## 2024-01-31 DIAGNOSIS — F321 Major depressive disorder, single episode, moderate: Secondary | ICD-10-CM | POA: Diagnosis not present

## 2024-01-31 DIAGNOSIS — F4522 Body dysmorphic disorder: Secondary | ICD-10-CM | POA: Diagnosis not present

## 2024-01-31 DIAGNOSIS — F4389 Other reactions to severe stress: Secondary | ICD-10-CM | POA: Diagnosis not present

## 2024-01-31 DIAGNOSIS — F411 Generalized anxiety disorder: Secondary | ICD-10-CM | POA: Diagnosis not present

## 2024-02-04 DIAGNOSIS — F4522 Body dysmorphic disorder: Secondary | ICD-10-CM | POA: Diagnosis not present

## 2024-02-04 DIAGNOSIS — F321 Major depressive disorder, single episode, moderate: Secondary | ICD-10-CM | POA: Diagnosis not present

## 2024-02-04 DIAGNOSIS — F4389 Other reactions to severe stress: Secondary | ICD-10-CM | POA: Diagnosis not present

## 2024-02-04 DIAGNOSIS — F411 Generalized anxiety disorder: Secondary | ICD-10-CM | POA: Diagnosis not present

## 2024-02-07 DIAGNOSIS — F411 Generalized anxiety disorder: Secondary | ICD-10-CM | POA: Diagnosis not present

## 2024-02-07 DIAGNOSIS — F321 Major depressive disorder, single episode, moderate: Secondary | ICD-10-CM | POA: Diagnosis not present

## 2024-02-07 DIAGNOSIS — F4389 Other reactions to severe stress: Secondary | ICD-10-CM | POA: Diagnosis not present

## 2024-02-07 DIAGNOSIS — F4522 Body dysmorphic disorder: Secondary | ICD-10-CM | POA: Diagnosis not present

## 2024-02-11 ENCOUNTER — Telehealth: Payer: Self-pay | Admitting: Neurology

## 2024-02-11 DIAGNOSIS — F4389 Other reactions to severe stress: Secondary | ICD-10-CM | POA: Diagnosis not present

## 2024-02-11 DIAGNOSIS — F411 Generalized anxiety disorder: Secondary | ICD-10-CM | POA: Diagnosis not present

## 2024-02-11 DIAGNOSIS — F4522 Body dysmorphic disorder: Secondary | ICD-10-CM | POA: Diagnosis not present

## 2024-02-11 DIAGNOSIS — F321 Major depressive disorder, single episode, moderate: Secondary | ICD-10-CM | POA: Diagnosis not present

## 2024-02-11 NOTE — Telephone Encounter (Signed)
 Pt requested a MyChart Video appointment to discuss CT results

## 2024-02-14 DIAGNOSIS — F4522 Body dysmorphic disorder: Secondary | ICD-10-CM | POA: Diagnosis not present

## 2024-02-14 DIAGNOSIS — F4389 Other reactions to severe stress: Secondary | ICD-10-CM | POA: Diagnosis not present

## 2024-02-14 DIAGNOSIS — F321 Major depressive disorder, single episode, moderate: Secondary | ICD-10-CM | POA: Diagnosis not present

## 2024-02-14 DIAGNOSIS — F411 Generalized anxiety disorder: Secondary | ICD-10-CM | POA: Diagnosis not present

## 2024-02-15 DIAGNOSIS — J302 Other seasonal allergic rhinitis: Secondary | ICD-10-CM | POA: Diagnosis not present

## 2024-02-15 DIAGNOSIS — J45909 Unspecified asthma, uncomplicated: Secondary | ICD-10-CM | POA: Diagnosis not present

## 2024-02-18 DIAGNOSIS — F4389 Other reactions to severe stress: Secondary | ICD-10-CM | POA: Diagnosis not present

## 2024-02-18 DIAGNOSIS — F411 Generalized anxiety disorder: Secondary | ICD-10-CM | POA: Diagnosis not present

## 2024-02-18 DIAGNOSIS — F321 Major depressive disorder, single episode, moderate: Secondary | ICD-10-CM | POA: Diagnosis not present

## 2024-02-18 DIAGNOSIS — F4522 Body dysmorphic disorder: Secondary | ICD-10-CM | POA: Diagnosis not present

## 2024-02-21 DIAGNOSIS — F4389 Other reactions to severe stress: Secondary | ICD-10-CM | POA: Diagnosis not present

## 2024-02-21 DIAGNOSIS — F321 Major depressive disorder, single episode, moderate: Secondary | ICD-10-CM | POA: Diagnosis not present

## 2024-02-21 DIAGNOSIS — F4522 Body dysmorphic disorder: Secondary | ICD-10-CM | POA: Diagnosis not present

## 2024-02-21 DIAGNOSIS — F411 Generalized anxiety disorder: Secondary | ICD-10-CM | POA: Diagnosis not present

## 2024-02-25 DIAGNOSIS — F4522 Body dysmorphic disorder: Secondary | ICD-10-CM | POA: Diagnosis not present

## 2024-02-25 DIAGNOSIS — F411 Generalized anxiety disorder: Secondary | ICD-10-CM | POA: Diagnosis not present

## 2024-02-25 DIAGNOSIS — F321 Major depressive disorder, single episode, moderate: Secondary | ICD-10-CM | POA: Diagnosis not present

## 2024-02-28 DIAGNOSIS — F4389 Other reactions to severe stress: Secondary | ICD-10-CM | POA: Diagnosis not present

## 2024-02-28 DIAGNOSIS — F4522 Body dysmorphic disorder: Secondary | ICD-10-CM | POA: Diagnosis not present

## 2024-02-28 DIAGNOSIS — F321 Major depressive disorder, single episode, moderate: Secondary | ICD-10-CM | POA: Diagnosis not present

## 2024-02-28 DIAGNOSIS — F411 Generalized anxiety disorder: Secondary | ICD-10-CM | POA: Diagnosis not present

## 2024-03-01 DIAGNOSIS — R519 Headache, unspecified: Secondary | ICD-10-CM | POA: Diagnosis not present

## 2024-03-01 DIAGNOSIS — S060XAA Concussion with loss of consciousness status unknown, initial encounter: Secondary | ICD-10-CM | POA: Diagnosis not present

## 2024-03-02 ENCOUNTER — Encounter: Payer: Self-pay | Admitting: Neurology

## 2024-03-02 DIAGNOSIS — J111 Influenza due to unidentified influenza virus with other respiratory manifestations: Secondary | ICD-10-CM | POA: Diagnosis not present

## 2024-03-02 DIAGNOSIS — R059 Cough, unspecified: Secondary | ICD-10-CM | POA: Diagnosis not present

## 2024-03-02 DIAGNOSIS — R52 Pain, unspecified: Secondary | ICD-10-CM | POA: Diagnosis not present

## 2024-03-10 DIAGNOSIS — F4522 Body dysmorphic disorder: Secondary | ICD-10-CM | POA: Diagnosis not present

## 2024-03-10 DIAGNOSIS — F411 Generalized anxiety disorder: Secondary | ICD-10-CM | POA: Diagnosis not present

## 2024-03-10 DIAGNOSIS — F321 Major depressive disorder, single episode, moderate: Secondary | ICD-10-CM | POA: Diagnosis not present

## 2024-03-12 ENCOUNTER — Telehealth (INDEPENDENT_AMBULATORY_CARE_PROVIDER_SITE_OTHER): Admitting: Neurology

## 2024-03-12 DIAGNOSIS — S060XAA Concussion with loss of consciousness status unknown, initial encounter: Secondary | ICD-10-CM

## 2024-03-12 DIAGNOSIS — H5500 Unspecified nystagmus: Secondary | ICD-10-CM | POA: Diagnosis not present

## 2024-03-12 DIAGNOSIS — R42 Dizziness and giddiness: Secondary | ICD-10-CM | POA: Diagnosis not present

## 2024-03-12 DIAGNOSIS — S0990XA Unspecified injury of head, initial encounter: Secondary | ICD-10-CM

## 2024-03-12 DIAGNOSIS — E669 Obesity, unspecified: Secondary | ICD-10-CM

## 2024-03-12 DIAGNOSIS — R519 Headache, unspecified: Secondary | ICD-10-CM

## 2024-03-12 DIAGNOSIS — G43909 Migraine, unspecified, not intractable, without status migrainosus: Secondary | ICD-10-CM | POA: Diagnosis not present

## 2024-03-12 DIAGNOSIS — G43009 Migraine without aura, not intractable, without status migrainosus: Secondary | ICD-10-CM

## 2024-03-12 DIAGNOSIS — G43709 Chronic migraine without aura, not intractable, without status migrainosus: Secondary | ICD-10-CM
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
## 2024-03-12 NOTE — Patient Instructions (Addendum)
Post-Concussion Syndrome  A concussion is a brain injury from a direct hit to the head or body. This hit causes the brain to shake back and forth fast inside the skull. The shaking can damage brain cells and cause chemical changes in the brain. Concussions are normally not life-threatening but can cause serious symptoms. Post-concussion syndrome is when symptoms that happen after a concussion last longer than normal. These symptoms can last from weeks to months. What are the causes? The cause of this condition is not known. It can happen whether your head injury was mild or severe. What increases the risk? You are more likely to get this condition if: You are male. You are a child, teen, or young adult. You have had a head injury before. You have a history of headaches. You have depression or anxiety. You have more than one symptom or severe symptoms when your concussion occurs. You faint or cannot remember the event (have amnesia of the event). What are the signs or symptoms? Symptoms of this condition include: Physical symptoms, such as: Headaches. Tiredness. Dizziness and weakness. Blurred vision and sensitivity to light. Trouble hearing. Problems with balance. Mental and emotional symptoms, such as: Memory problems and trouble focusing. Trouble falling asleep or staying asleep (insomnia). Feeling irritable. Anxiety or depression. Trouble learning new things. How is this diagnosed? This condition may be diagnosed based on: Your symptoms. A description of your injury. Your medical history. Testing your strength, balance, and nerve function (neurological exam). Your health care provider may order other tests. These may include: Brain imaging, such as a CT scan or an MRI. Memory testing (neuropsychological testing). How is this treated? Treatment for this condition may depend on your symptoms. Symptoms normally go away on their own with time. If you need treatment, it may  include: Medicines for headaches, anxiety, depression, and insomnia. Resting your brain and body for a few days after your injury. Rehab therapy, such as: Physical or occupational therapy. This may include exercises to help with balance and dizziness. Mental health counseling. A form of talk therapy called cognitive behavioral therapy (CBT) can be especially helpful. This therapy helps you set goals and follow up on the changes that you make. Speech therapy. Vision therapy. A brain and eye specialist can recommend treatments for vision problems. Follow these instructions at home: Medicines Take over-the-counter and prescription medicines only as told by your health care provider. Avoid opioid prescription pain medicines when getting over a concussion. Activity Limit your mental activities for the first few days after your injury. This may include not doing these things: Homework or work for your job. Complex thinking. Watching TV. Using a computer or phone. Playing memory games and puzzles. Slowly return to your normal activity level. If a certain activity brings on your symptoms, stop or slow down until you can do the activity without getting symptoms again. Limit physical activity, such as sports or strenuous activities, for the first few days after a concussion. Slowly return to normal activity as told by your health care provider. Light exercise may be helpful. Rest helps your brain heal. Make sure you: Get plenty of sleep at night. Most adults should get at least 7-9 hours of sleep each night. Rest during the day. Take naps or rest breaks when you feel tired. Do not do high-risk activities that could cause a second concussion, such as riding a bike or playing sports. Having another concussion before the first one has healed can be harmful. General instructions  Do  not drink alcohol until your health care provider says that you can. Keep track of how severe your symptoms are and how  often they happen. Give this information to your health care provider. Keep all follow-up visits. Your health care provider may need to check you for new or serious symptoms. Where to find more information [employer]: concussionfoundation.org Contact a health care provider if: Your symptoms do not improve. You get injured again. You have unusual behavior changes. Get help right away if: You have a severe or worsening headache. You have weakness or numbness in any part of your body. You vomit repeatedly. You have mental status changes, such as: Confusion. Trouble speaking. Trouble staying awake. Fainting. You have a seizure. These symptoms may be an emergency. Get help right away. Call 911. Do not wait to see if the symptoms will go away. Do not drive yourself to the hospital. Also, get help right away if: You think about hurting yourself or others. Take one of these steps if you feel like you may hurt yourself or others, or have thoughts about taking your own life: Go to your nearest emergency room. Call 911. Call the National Suicide Prevention Lifeline at 410-806-6033 or 988. This is open 24 hours a day. Text the Crisis Text Line at 443-649-6263. This information is not intended to replace advice given to you by your health care provider. Make sure you discuss any questions you have with your health care provider. Document Revised: 03/31/2022 Document Reviewed: 03/31/2022 [employer] Patient Education  2024 [employer].

## 2024-03-13 ENCOUNTER — Encounter: Payer: Self-pay | Admitting: Neurology

## 2024-03-13 ENCOUNTER — Encounter: Payer: Self-pay | Admitting: *Deleted

## 2024-03-13 DIAGNOSIS — F411 Generalized anxiety disorder: Secondary | ICD-10-CM | POA: Diagnosis not present

## 2024-03-13 DIAGNOSIS — F4389 Other reactions to severe stress: Secondary | ICD-10-CM | POA: Diagnosis not present

## 2024-03-13 DIAGNOSIS — F4522 Body dysmorphic disorder: Secondary | ICD-10-CM | POA: Diagnosis not present

## 2024-03-13 DIAGNOSIS — F321 Major depressive disorder, single episode, moderate: Secondary | ICD-10-CM | POA: Diagnosis not present

## 2024-03-17 DIAGNOSIS — F321 Major depressive disorder, single episode, moderate: Secondary | ICD-10-CM | POA: Diagnosis not present

## 2024-03-17 DIAGNOSIS — F4522 Body dysmorphic disorder: Secondary | ICD-10-CM | POA: Diagnosis not present

## 2024-03-17 DIAGNOSIS — F411 Generalized anxiety disorder: Secondary | ICD-10-CM | POA: Diagnosis not present

## 2024-03-17 DIAGNOSIS — F4389 Other reactions to severe stress: Secondary | ICD-10-CM | POA: Diagnosis not present

## 2024-03-20 DIAGNOSIS — F4522 Body dysmorphic disorder: Secondary | ICD-10-CM | POA: Diagnosis not present

## 2024-03-20 DIAGNOSIS — F321 Major depressive disorder, single episode, moderate: Secondary | ICD-10-CM | POA: Diagnosis not present

## 2024-03-20 DIAGNOSIS — F411 Generalized anxiety disorder: Secondary | ICD-10-CM | POA: Diagnosis not present

## 2024-03-20 DIAGNOSIS — F4389 Other reactions to severe stress: Secondary | ICD-10-CM | POA: Diagnosis not present

## 2024-03-24 DIAGNOSIS — F4389 Other reactions to severe stress: Secondary | ICD-10-CM | POA: Diagnosis not present

## 2024-03-24 DIAGNOSIS — M488X2 Other specified spondylopathies, cervical region: Secondary | ICD-10-CM | POA: Diagnosis not present

## 2024-03-24 DIAGNOSIS — M9902 Segmental and somatic dysfunction of thoracic region: Secondary | ICD-10-CM | POA: Diagnosis not present

## 2024-03-24 DIAGNOSIS — M9904 Segmental and somatic dysfunction of sacral region: Secondary | ICD-10-CM | POA: Diagnosis not present

## 2024-03-24 DIAGNOSIS — M4003 Postural kyphosis, cervicothoracic region: Secondary | ICD-10-CM | POA: Diagnosis not present

## 2024-03-24 DIAGNOSIS — F321 Major depressive disorder, single episode, moderate: Secondary | ICD-10-CM | POA: Diagnosis not present

## 2024-03-24 DIAGNOSIS — G4486 Cervicogenic headache: Secondary | ICD-10-CM | POA: Diagnosis not present

## 2024-03-24 DIAGNOSIS — M5137 Other intervertebral disc degeneration, lumbosacral region with discogenic back pain only: Secondary | ICD-10-CM | POA: Diagnosis not present

## 2024-03-24 DIAGNOSIS — F4522 Body dysmorphic disorder: Secondary | ICD-10-CM | POA: Diagnosis not present

## 2024-03-24 DIAGNOSIS — F411 Generalized anxiety disorder: Secondary | ICD-10-CM | POA: Diagnosis not present

## 2024-03-27 DIAGNOSIS — F321 Major depressive disorder, single episode, moderate: Secondary | ICD-10-CM | POA: Diagnosis not present

## 2024-03-27 DIAGNOSIS — F4522 Body dysmorphic disorder: Secondary | ICD-10-CM | POA: Diagnosis not present

## 2024-03-27 DIAGNOSIS — F4389 Other reactions to severe stress: Secondary | ICD-10-CM | POA: Diagnosis not present

## 2024-03-27 DIAGNOSIS — F411 Generalized anxiety disorder: Secondary | ICD-10-CM | POA: Diagnosis not present

## 2024-03-31 DIAGNOSIS — F4389 Other reactions to severe stress: Secondary | ICD-10-CM | POA: Diagnosis not present

## 2024-03-31 DIAGNOSIS — F4522 Body dysmorphic disorder: Secondary | ICD-10-CM | POA: Diagnosis not present

## 2024-03-31 DIAGNOSIS — F321 Major depressive disorder, single episode, moderate: Secondary | ICD-10-CM | POA: Diagnosis not present

## 2024-03-31 DIAGNOSIS — F411 Generalized anxiety disorder: Secondary | ICD-10-CM | POA: Diagnosis not present

## 2024-04-02 DIAGNOSIS — F4522 Body dysmorphic disorder: Secondary | ICD-10-CM | POA: Diagnosis not present

## 2024-04-02 DIAGNOSIS — F4389 Other reactions to severe stress: Secondary | ICD-10-CM | POA: Diagnosis not present

## 2024-04-02 DIAGNOSIS — F411 Generalized anxiety disorder: Secondary | ICD-10-CM | POA: Diagnosis not present

## 2024-04-02 DIAGNOSIS — F321 Major depressive disorder, single episode, moderate: Secondary | ICD-10-CM | POA: Diagnosis not present

## 2024-04-07 DIAGNOSIS — F411 Generalized anxiety disorder: Secondary | ICD-10-CM | POA: Diagnosis not present

## 2024-04-07 DIAGNOSIS — F321 Major depressive disorder, single episode, moderate: Secondary | ICD-10-CM | POA: Diagnosis not present

## 2024-04-07 DIAGNOSIS — F4389 Other reactions to severe stress: Secondary | ICD-10-CM | POA: Diagnosis not present

## 2024-04-07 DIAGNOSIS — F4522 Body dysmorphic disorder: Secondary | ICD-10-CM | POA: Diagnosis not present

## 2024-04-10 DIAGNOSIS — F4389 Other reactions to severe stress: Secondary | ICD-10-CM | POA: Diagnosis not present

## 2024-04-10 DIAGNOSIS — F321 Major depressive disorder, single episode, moderate: Secondary | ICD-10-CM | POA: Diagnosis not present

## 2024-04-10 DIAGNOSIS — F411 Generalized anxiety disorder: Secondary | ICD-10-CM | POA: Diagnosis not present

## 2024-04-10 DIAGNOSIS — F4522 Body dysmorphic disorder: Secondary | ICD-10-CM | POA: Diagnosis not present

## 2024-04-15 ENCOUNTER — Telehealth: Payer: Self-pay | Admitting: Neurology

## 2024-04-15 DIAGNOSIS — F4389 Other reactions to severe stress: Secondary | ICD-10-CM | POA: Diagnosis not present

## 2024-04-15 DIAGNOSIS — F321 Major depressive disorder, single episode, moderate: Secondary | ICD-10-CM | POA: Diagnosis not present

## 2024-04-15 DIAGNOSIS — F4522 Body dysmorphic disorder: Secondary | ICD-10-CM | POA: Diagnosis not present

## 2024-04-15 DIAGNOSIS — F411 Generalized anxiety disorder: Secondary | ICD-10-CM | POA: Diagnosis not present

## 2024-04-15 NOTE — Telephone Encounter (Signed)
 I see pt is scheduled for 5/29.

## 2024-04-15 NOTE — Telephone Encounter (Signed)
 Called pt and scheduled for 04/17/24 @ 10:00 am.

## 2024-04-15 NOTE — Telephone Encounter (Signed)
 Pt called wanting to schedule an appt sooner than the Oct appt I have offered her. She states that she is having visual issues and other things going on that can not wait till Oct. Please advise.

## 2024-04-15 NOTE — Telephone Encounter (Signed)
 Jillian/Angel Can we give her an appointment please? I think I had a cancellation thuesday. Can we offer her 10am Thursday morning? I'll ask new referrals to put in new patients for 930am and 1030am if they have any thanks

## 2024-04-16 DIAGNOSIS — J4520 Mild intermittent asthma, uncomplicated: Secondary | ICD-10-CM | POA: Diagnosis not present

## 2024-04-16 DIAGNOSIS — F331 Major depressive disorder, recurrent, moderate: Secondary | ICD-10-CM | POA: Diagnosis not present

## 2024-04-16 DIAGNOSIS — F411 Generalized anxiety disorder: Secondary | ICD-10-CM | POA: Diagnosis not present

## 2024-04-17 ENCOUNTER — Ambulatory Visit (INDEPENDENT_AMBULATORY_CARE_PROVIDER_SITE_OTHER): Admitting: Neurology

## 2024-04-17 ENCOUNTER — Encounter: Payer: Self-pay | Admitting: Neurology

## 2024-04-17 VITALS — BP 118/78 | HR 82 | Ht 68.0 in | Wt 272.8 lb

## 2024-04-17 DIAGNOSIS — F411 Generalized anxiety disorder: Secondary | ICD-10-CM | POA: Diagnosis not present

## 2024-04-17 DIAGNOSIS — F4522 Body dysmorphic disorder: Secondary | ICD-10-CM | POA: Diagnosis not present

## 2024-04-17 DIAGNOSIS — S060X9A Concussion with loss of consciousness of unspecified duration, initial encounter: Secondary | ICD-10-CM

## 2024-04-17 DIAGNOSIS — G43501 Persistent migraine aura without cerebral infarction, not intractable, with status migrainosus: Secondary | ICD-10-CM

## 2024-04-17 DIAGNOSIS — F4389 Other reactions to severe stress: Secondary | ICD-10-CM | POA: Diagnosis not present

## 2024-04-17 DIAGNOSIS — F321 Major depressive disorder, single episode, moderate: Secondary | ICD-10-CM | POA: Diagnosis not present

## 2024-04-17 NOTE — Progress Notes (Signed)
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 with nystagmus as below:  Head Trauma: concussion ongoing  Physical symptoms Headaches. stable Dizziness and problems with coordination or balance: worse Sensitivity to light or noise: Ligh especially difficult Nausea or vomiting: In general but worse with the vertigo with vomiting Tiredness (fatigue): YES Vision or hearing problems: Yes, nystagmus . Mental and emotional symptoms Irritability or mood changes:.worse Memory problems: Dificult time navigating, visual spatial recognition, word finding, working [employer] concentrating, organizing, or making decisions: yes Changes in eating or sleeping patterns: Difficulty sleeping, insomnia Slowness in thinking, acting or reacting, speaking, or reading: YES Anxiety or depression: worse.   Discussed:  Low dose adderall or other for attention/cogtion talk to therapist  Aricept at bedtime can help with cognitive problems temporarily Do not  like amitriptyline due to side effects even though it is used post consussion can help with sleep but side effects are worse in my opinion but can discuss with therapist  Continue clonazepam  for vertigo Lamictal is good for migraine aura if we consider the vertigo a migraine aura and is a good mood stabilizer  Article vertigo and vitamin d and calcium  Magnesium doses and other OTC medications you can try: 200 mg mag citrate 1-2x daily 400 mg mag oxide 1-2x daily Mag sulfate 600 mg daily  Other vitamin supplementation that may work includes 1000 international units vitamin D3 daily 500 to 1000 mg of B12 daily Riboflavin 400 mg a day 150 to 300 mg co-Q10 daily  Concussion occupational therapy/speech therapy  Exam with dizziness, imbalance, nystagmus,   Concussions usually do not show up on imaging   Migraines are well controlled the vertigo is disabling. She feels like she is moving and nystagmus causing vision changes and episodes f vertigo constantl off balance episodes of frank vertigo several times a day, even moton will trigger. 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  Migraines/aura: try Lamictal  2-3 times a week nystagmus. Fast component is to the left and has a rotational componentAnd they have depth perception issues with it. Starts acutely. I have reviewed videos and appears the fast component is to the left and their left ear feels full. Also +Dix Hallpike to the left. They have has tried steroids, vestibular therapy, need to send to ENT for thorough examination maybe VNG or ottheirtesting, meniere's disease?  In the past had these episodes with flashing lights does not happen with the migraine pain or prior to migraine headache. No trigger, worse with movement to the left.  MRI brain an orbits normal. NOT associated with migrained ENT REFERRAL:   per patient evaluation was normal  2. Head Trauma:  recent head trauma, CT head negative, still with concussive symptoms but improving,   . Migraines: doing great on ajovy , has been life changing, nurtec helps acutely, will refill. On the Ajovy  not getting many headaches maybe 5 a month and < 10 totla headache days a month prior to Ajovy  had > 20 migraine days a month and daily headaches.     No orders of the defined types were placed in this encounter.  Meds ordered this encounter  Medications   amphetamine-dextroamphetamine (ADDERALL) 10 MG tablet    Sig: Take 1 tablet (10 mg total) by mouth daily with breakfast.    Dispense:  30 tablet    Refill:  0   donepezil (ARICEPT) 5 MG tablet    Sig: Take 1 tablet (5 mg total) by mouth at bedtime.    Dispense:  30 tablet    Refill:  11   lamoTRIgine (LAMICTAL XR) 50 MG 24 hour tablet    Sig: Take 1 tablet (50 mg total) by mouth daily.    Dispense:  30 tablet    Refill:  11     Cc: [name], MD,  [name], MD  Aldona Amel, MD  [facility] [address] [place], [place] [address]  Phone [phone] Fax [phone]  I spent 36 minutes of face-to-face and non-face-to-face time with patient on the  1. Concussion with loss of consciousness, initial encounter   2. 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  This included previsit chart review, lab review, study review, order entry, electronic health record documentation, patient education on the different diagnostic and therapeutic options, counseling and coordination of care, risks and benefits of management, compliance, or risk factor reduction

## 2024-04-17 NOTE — Patient Instructions (Addendum)
 Low dose adderall or other for attention/cogtion talk to therapist  Aricept at bedtime can help with cognitive problems temporarily Do not like amitriptyline due to side effects even though it is used post consussion can help with sleep but side effects are worse in my opinion but can discuss with therapist  Continue clonazepam  for vertigo Lamictal is good for migraine aura if we consider the vertigo a migraine aura and is a good mood stabilizer  Article vertigo and vitamin d and calcium  Magnesium doses and other OTC medications you can try: 200 mg mag citrate 1-2x daily 400 mg mag oxide 1-2x daily Mag sulfate 600 mg daily  Other vitamin supplementation that may work includes 1000 international units vitamin D3 daily 500 to 1000 mg of B12 daily Riboflavin 400 mg a day 150 to 300 mg co-Q10 daily       Concussion, Adult  A concussion is a brain injury from a hard, direct hit (trauma) to the head or body. This direct hit causes the brain to shake quickly back and forth inside the skull. This can damage brain cells and cause chemical changes in the brain. A concussion may also be known as a mild traumatic brain injury (TBI). The effects of a concussion can be serious. If you have a concussion, you should be very careful to avoid having a second concussion. What are the causes? This condition is caused by: A direct hit to your head. Sudden movement of your body that causes your brain to move back and forth inside the skull, such as in a car crash. What are the signs or symptoms? The signs of a concussion can be hard to notice. Early on, they may be missed by you, family members, and health care providers. You may look fine on the outside but may act or feel differently. Every head injury is different. Symptoms are usually temporary but may last for days, weeks, or even months. Some symptoms appear right away, but other symptoms may not show up for hours or days. Physical  symptoms Headaches. Dizziness and problems with coordination or balance. Sensitivity to light or noise. Nausea or vomiting. Tiredness (fatigue). Vision or hearing problems. Seizure. Mental and emotional symptoms Irritability or mood changes. Memory problems. Trouble concentrating, organizing, or making decisions. Changes in eating or sleeping patterns. Slowness in thinking, acting or reacting, speaking, or reading. Anxiety or depression. How is this diagnosed? This condition is diagnosed based on your symptoms and injury. You may also have tests, including: Imaging tests, such as a CT scan or an MRI. Neuropsychological tests. These measure your thinking, understanding, learning, and memory. How is this treated? Treatment for this condition includes: Stopping sports or activity if you are injured. Physical and mental rest and careful observation, usually at home. Medicines to help with symptoms such as headaches, nausea, or difficulty sleeping. Referral to a concussion clinic or rehab center. Follow these instructions at home: Activity Limit activities that require a lot of thought or concentration, such as: Doing homework or job-related work. Watching TV. Using the computer or phone. Playing memory games and doing puzzles. Rest helps your brain heal. Make sure you: Get plenty of sleep. Most adults should get 7-9 hours of sleep each night. Rest during the day. Take naps or rest breaks when you feel tired. Avoid high-intensity exercise or physical activities that take a lot of effort. Stop any activity that worsens symptoms. Your health care provider may recommend light exercise such as walking. Do not do high-risk  activities that could cause a second concussion, such as riding a bike or playing sports. Ask your health care provider when you can return to your normal activities, such as school, work, sports, and driving. Your ability to react may be slower after a brain injury.  Never do these activities if you are dizzy. General instructions  Take over-the-counter and prescription medicines only as told by your health care provider. Some medicines, such as blood thinners (anticoagulants) and aspirin, may increase the risk for complications, such as bleeding. Avoid taking opioid pain medicine while recovering from a concussion. Do not drink alcohol until your health care provider says you can. Drinking alcohol may slow your recovery and can put you at risk of further injury. Watch your symptoms and tell others around you to do the same. Complications sometimes occur after a concussion. Tell your work Production designer, theatre/television/film, teachers, Tax adviser, school counselor, coach, or sports trainer about your injury, symptoms, and restrictions. See a mental health therapist if you feel anxious or depressed. Managing this condition can be challenging. Keep all follow-up visits. Your health care provider will check on your recovery and give you a plan for returning to activities. How is this prevented? Avoiding another brain injury is very important. In rare cases, another injury can lead to permanent brain damage, brain swelling, or death. The risk of this is greatest during the first 7-10 days after a head injury. Avoid injuries by: Stopping activities that could lead to a second concussion, such as contact or recreational sports, until your health care provider says it is okay. Taking these actions once you have returned to sports or activities: Avoid plays or moves that can cause you to crash into another person. This is how most concussions occur. Follow the rules and be respectful of other players. Do not engage in violent or illegal plays. Getting regular exercise that includes strength and balance training. Wearing a properly fitting helmet during sports, biking, or other activities. Helmets can help protect you from serious skull and brain injuries, but they may not protect you from a  concussion. Even when wearing a helmet, you should avoid being hit in the head. Where to find more information Centers for Disease Control and Prevention: TonerPromos.no Contact a health care provider if: Your symptoms do not improve or get worse. You have new symptoms. You have another injury. Your coordination gets worse. You have unusual behavior changes. Get help right away if: You have a severe or worsening headache. You have weakness or numbness in any part of your body, slurred speech, vision changes, or confusion. You vomit repeatedly. You lose consciousness, are sleepier than normal, or are difficult to wake up. You have a seizure. These symptoms may be an emergency. Get help right away. Call 911. Do not wait to see if the symptoms will go away. Do not drive yourself to the hospital. Also, get help right away if: You have thoughts of hurting yourself or others. Take one of these steps if you feel like you may hurt yourself or others, or have thoughts about taking your own life: Go to your nearest emergency room. Call 911. Call the National Suicide Prevention Lifeline at (579)728-1014 or 988. This is open 24 hours a day. Text the Crisis Text Line at 571-619-7501. This information is not intended to replace advice given to you by your health care provider. Make sure you discuss any questions you have with your health care provider. Document Revised: 03/31/2022 Document Reviewed: 03/31/2022 Elsevier Patient Education  2024 Elsevier Inc.

## 2024-04-21 ENCOUNTER — Other Ambulatory Visit: Payer: Self-pay | Admitting: Neurology

## 2024-04-21 ENCOUNTER — Encounter: Payer: Self-pay | Admitting: Neurology

## 2024-04-21 DIAGNOSIS — F411 Generalized anxiety disorder: Secondary | ICD-10-CM | POA: Diagnosis not present

## 2024-04-21 DIAGNOSIS — F4522 Body dysmorphic disorder: Secondary | ICD-10-CM | POA: Diagnosis not present

## 2024-04-21 DIAGNOSIS — F4389 Other reactions to severe stress: Secondary | ICD-10-CM | POA: Diagnosis not present

## 2024-04-21 DIAGNOSIS — F321 Major depressive disorder, single episode, moderate: Secondary | ICD-10-CM | POA: Diagnosis not present

## 2024-04-21 DIAGNOSIS — G43501 Persistent migraine aura without cerebral infarction, not intractable, with status migrainosus: Secondary | ICD-10-CM

## 2024-04-21 MED ORDER — AMPHETAMINE-DEXTROAMPHETAMINE 10 MG PO TABS: 10.0000 mg | ORAL_TABLET | Freq: Every day | ORAL | 0 refills | Status: DC

## 2024-04-21 MED ORDER — LAMOTRIGINE ER 50 MG PO TB24: 50.0000 mg | ORAL_TABLET | Freq: Every day | ORAL | 11 refills | Status: DC

## 2024-04-21 MED ORDER — DONEPEZIL HCL 5 MG PO TABS: 5.0000 mg | ORAL_TABLET | Freq: Every day | ORAL | 11 refills | Status: DC

## 2024-04-21 NOTE — Addendum Note (Signed)
 Addended by: Lonya Johannesen B on: 04/21/2024 06:37 PM   Modules accepted: Orders

## 2024-04-22 NOTE — Telephone Encounter (Signed)
 Spoke with [facility] pharmacy. Lamotrigine XR is being ordered. They didn't have it in stock. I let the pt know they need to bring new insurance to the pharmacy at pickup.

## 2024-04-24 ENCOUNTER — Other Ambulatory Visit: Payer: Self-pay | Admitting: Neurology

## 2024-04-24 ENCOUNTER — Telehealth: Payer: Self-pay | Admitting: Neurology

## 2024-04-24 DIAGNOSIS — F321 Major depressive disorder, single episode, moderate: Secondary | ICD-10-CM | POA: Diagnosis not present

## 2024-04-24 DIAGNOSIS — F4389 Other reactions to severe stress: Secondary | ICD-10-CM | POA: Diagnosis not present

## 2024-04-24 DIAGNOSIS — F4522 Body dysmorphic disorder: Secondary | ICD-10-CM | POA: Diagnosis not present

## 2024-04-24 DIAGNOSIS — G43501 Persistent migraine aura without cerebral infarction, not intractable, with status migrainosus: Secondary | ICD-10-CM

## 2024-04-24 DIAGNOSIS — F411 Generalized anxiety disorder: Secondary | ICD-10-CM | POA: Diagnosis not present

## 2024-04-24 MED ORDER — LAMOTRIGINE ER 25 MG PO TB24: 25.0000 mg | ORAL_TABLET | Freq: Every day | ORAL | 6 refills | Status: DC

## 2024-04-24 NOTE — Telephone Encounter (Signed)
 Spoke with the pharmacist at [facility]. She is getting hard stops when trying to fill Lamotrigine 50 mg. The recommendation is to start with 25 mg and taper, even with migraine prophylaxis. Will d/w Dr Tresia Fruit and call pharmacy back.

## 2024-04-24 NOTE — Telephone Encounter (Signed)
 Spoke with Dr Tresia Fruit who d/c'd the lamotrigine 50 mg and prescribed 25 mg to take daily instead. Spoke with Mia at [facility] to let her know. Rx has been received. I also updated patient.

## 2024-04-24 NOTE — Telephone Encounter (Signed)
 Pharmacist Mia is asking for a call from RN to discuss pt's  lamoTRIgine (LAMICTAL XR) 50 MG 24 hour tablet

## 2024-04-28 DIAGNOSIS — F4389 Other reactions to severe stress: Secondary | ICD-10-CM | POA: Diagnosis not present

## 2024-04-28 DIAGNOSIS — F411 Generalized anxiety disorder: Secondary | ICD-10-CM | POA: Diagnosis not present

## 2024-04-28 DIAGNOSIS — F321 Major depressive disorder, single episode, moderate: Secondary | ICD-10-CM | POA: Diagnosis not present

## 2024-04-28 DIAGNOSIS — F4522 Body dysmorphic disorder: Secondary | ICD-10-CM | POA: Diagnosis not present

## 2024-05-01 DIAGNOSIS — F321 Major depressive disorder, single episode, moderate: Secondary | ICD-10-CM | POA: Diagnosis not present

## 2024-05-01 DIAGNOSIS — F4522 Body dysmorphic disorder: Secondary | ICD-10-CM | POA: Diagnosis not present

## 2024-05-01 DIAGNOSIS — F411 Generalized anxiety disorder: Secondary | ICD-10-CM | POA: Diagnosis not present

## 2024-05-01 DIAGNOSIS — F4389 Other reactions to severe stress: Secondary | ICD-10-CM | POA: Diagnosis not present

## 2024-05-07 ENCOUNTER — Encounter: Payer: Self-pay | Admitting: *Deleted

## 2024-05-07 NOTE — Telephone Encounter (Signed)
 Short term disability forms completed, signed, and sent to medical records for processing.

## 2024-05-07 NOTE — Telephone Encounter (Signed)
 Form is in process. Discussed with Dr Tresia Fruit.

## 2024-05-08 ENCOUNTER — Other Ambulatory Visit (HOSPITAL_COMMUNITY): Payer: Self-pay

## 2024-05-08 ENCOUNTER — Telehealth: Payer: Self-pay

## 2024-05-08 ENCOUNTER — Telehealth: Payer: Self-pay | Admitting: *Deleted

## 2024-05-08 NOTE — Telephone Encounter (Signed)
 Pharmacy Patient Advocate Encounter   Received notification from Physician's Office that prior authorization for Ketorolac  Tromethamine  60MG /2ML intramuscular solutions is required/requested.   Insurance verification completed.   The patient is insured through [facility] .   Per test claim: PA required; PA submitted to above mentioned insurance via CoverMyMeds Key/confirmation #/EOC [id number] Status is pending

## 2024-05-08 NOTE — Telephone Encounter (Signed)
 [name] from [facility] reports this has been denied, pt has been informed , if there are questions the call back # is [phone] option 3 option 1

## 2024-05-08 NOTE — Telephone Encounter (Signed)
 I sent the patient a mychart message about Florette Hurry requirement from insurance.

## 2024-05-08 NOTE — Telephone Encounter (Signed)
Thanks I let the patient know.

## 2024-05-08 NOTE — Telephone Encounter (Signed)
 Melia Sprague from [facility] called letting us  know that the pt's clonazePAM  0.25MG  dispersible tablets has been Approved 05/08/24-05/08/25.

## 2024-05-08 NOTE — Telephone Encounter (Signed)
 Noted, I notified the patient.

## 2024-05-08 NOTE — Telephone Encounter (Signed)
 Pt has called and due to new insurance [employer] needs PA on clonazepam , ajovy , nurtec and ketorlac.

## 2024-05-08 NOTE — Telephone Encounter (Signed)
 Pharmacy Patient Advocate Encounter  Received notification from [facility] that Prior Authorization for AJOVY  (fremanezumab -vfrm) injection 225MG /1.5ML auto-injectors has been APPROVED from 05/08/2024 to 07/31/2024. Ran test claim, Copay is $24.98. This test claim was processed through [facility]- copay amounts may vary at other pharmacies due to pharmacy/plan contracts, or as the patient moves through the different stages of their insurance plan.   PA #/Case ID/Reference #: PA Case ID #: [id number]

## 2024-05-08 NOTE — Telephone Encounter (Signed)
 Pharmacy Patient Advocate Encounter   Received notification from Physician's Office that prior authorization for clonazePAM  0.25MG  dispersible tablets is required/requested.   Insurance verification completed.   The patient is insured through [facility] .   Per test claim: PA required; PA submitted to above mentioned insurance via CoverMyMeds Key/confirmation #/EOC [id number] Status is pending

## 2024-05-08 NOTE — Telephone Encounter (Signed)
 Pharmacy Patient Advocate Encounter  Received notification from [facility] that Prior Authorization for Nurtec 75MG  dispersible tablets has been DENIED.  Full denial letter will be uploaded to the media tab. See denial reason below.   PA #/Case ID/Reference #: PA Case ID #: [id number]

## 2024-05-08 NOTE — Telephone Encounter (Signed)
 Pharmacy Patient Advocate Encounter   Received notification from Physician's Office that prior authorization for Nurtec 75MG  dispersible tablets is required/requested.   Insurance verification completed.   The patient is insured through [facility] .   Per test claim: PA required; PA submitted to above mentioned insurance via CoverMyMeds Key/confirmation #/EOC [id number] Status is pending

## 2024-05-08 NOTE — Telephone Encounter (Signed)
 Pharmacy Patient Advocate Encounter   Received notification from Physician's Office that prior authorization for AJOVY  (fremanezumab -vfrm) injection 225MG /1.5ML auto-injectors is required/requested.   Insurance verification completed.   The patient is insured through [facility] .   Per test claim: PA required; PA submitted to above mentioned insurance via CoverMyMeds Key/confirmation #/EOC [id number] Status is pending

## 2024-05-09 DIAGNOSIS — M9902 Segmental and somatic dysfunction of thoracic region: Secondary | ICD-10-CM | POA: Diagnosis not present

## 2024-05-09 DIAGNOSIS — M488X2 Other specified spondylopathies, cervical region: Secondary | ICD-10-CM | POA: Diagnosis not present

## 2024-05-09 DIAGNOSIS — M9904 Segmental and somatic dysfunction of sacral region: Secondary | ICD-10-CM | POA: Diagnosis not present

## 2024-05-09 DIAGNOSIS — G4486 Cervicogenic headache: Secondary | ICD-10-CM | POA: Diagnosis not present

## 2024-05-09 NOTE — Telephone Encounter (Signed)
 Pharmacy Patient Advocate Encounter  Received notification from [facility] that Prior Authorization for Ketorolac  Tromethamine  60MG /2ML intramuscular solutions has been APPROVED from 05/08/2024 to 05/08/2025   PA #/Case ID/Reference #: PA Case ID #: [id number]

## 2024-05-09 NOTE — Telephone Encounter (Signed)
 Stacy from [facility] called wanting to inform the office that the medication has been approved from 05/08/24-05/08/25 and it has been called in to the pharmacy for the pt and should be ready to be picked up within the hour.

## 2024-05-09 NOTE — Telephone Encounter (Signed)
 Pharmacy Patient Advocate Encounter  Received notification from [facility] that Prior Authorization for clonazePAM  0.25MG  dispersible tablets has been APPROVED from 05/08/2024 to 05/08/2025   PA #/Case ID/Reference #: PA Case ID #: [id number]

## 2024-05-12 DIAGNOSIS — F411 Generalized anxiety disorder: Secondary | ICD-10-CM | POA: Diagnosis not present

## 2024-05-12 DIAGNOSIS — F4522 Body dysmorphic disorder: Secondary | ICD-10-CM | POA: Diagnosis not present

## 2024-05-12 DIAGNOSIS — F4389 Other reactions to severe stress: Secondary | ICD-10-CM | POA: Diagnosis not present

## 2024-05-12 DIAGNOSIS — F321 Major depressive disorder, single episode, moderate: Secondary | ICD-10-CM | POA: Diagnosis not present

## 2024-05-12 NOTE — Telephone Encounter (Signed)
 Spoke with pt and scheduled VV with Dr. Ines for 05/21/24 at 7:30am

## 2024-05-15 DIAGNOSIS — F4522 Body dysmorphic disorder: Secondary | ICD-10-CM | POA: Diagnosis not present

## 2024-05-15 DIAGNOSIS — F321 Major depressive disorder, single episode, moderate: Secondary | ICD-10-CM | POA: Diagnosis not present

## 2024-05-15 DIAGNOSIS — F4389 Other reactions to severe stress: Secondary | ICD-10-CM | POA: Diagnosis not present

## 2024-05-15 DIAGNOSIS — F411 Generalized anxiety disorder: Secondary | ICD-10-CM | POA: Diagnosis not present

## 2024-05-19 DIAGNOSIS — F4389 Other reactions to severe stress: Secondary | ICD-10-CM | POA: Diagnosis not present

## 2024-05-19 DIAGNOSIS — F4522 Body dysmorphic disorder: Secondary | ICD-10-CM | POA: Diagnosis not present

## 2024-05-19 DIAGNOSIS — F321 Major depressive disorder, single episode, moderate: Secondary | ICD-10-CM | POA: Diagnosis not present

## 2024-05-19 DIAGNOSIS — F411 Generalized anxiety disorder: Secondary | ICD-10-CM | POA: Diagnosis not present

## 2024-05-21 ENCOUNTER — Telehealth (INDEPENDENT_AMBULATORY_CARE_PROVIDER_SITE_OTHER): Admitting: Neurology

## 2024-05-21 ENCOUNTER — Encounter: Payer: Self-pay | Admitting: Neurology

## 2024-05-21 ENCOUNTER — Other Ambulatory Visit: Payer: Self-pay | Admitting: Neurology

## 2024-05-21 DIAGNOSIS — S060X0D Concussion without loss of consciousness, subsequent encounter: Secondary | ICD-10-CM | POA: Diagnosis not present

## 2024-05-21 DIAGNOSIS — S060X9A Concussion with loss of consciousness of unspecified duration, initial encounter: Secondary | ICD-10-CM

## 2024-05-21 DIAGNOSIS — G43501 Persistent migraine aura without cerebral infarction, not intractable, with status migrainosus: Secondary | ICD-10-CM

## 2024-05-21 MED ORDER — AMPHETAMINE-DEXTROAMPHETAMINE 10 MG PO TABS: 10.0000 mg | ORAL_TABLET | Freq: Every day | ORAL | 0 refills | Status: DC

## 2024-05-21 MED ORDER — AMPHETAMINE-DEXTROAMPHETAMINE 10 MG PO TABS
10.0000 mg | ORAL_TABLET | Freq: Every day | ORAL | 0 refills | Status: DC
Start: 2024-05-21 — End: 2024-06-17

## 2024-05-21 MED ORDER — LAMOTRIGINE ER 25 MG PO TB24: 50.0000 mg | ORAL_TABLET | Freq: Every day | ORAL | 6 refills | Status: AC

## 2024-05-21 MED ORDER — DONEPEZIL HCL 5 MG PO TABS
5.0000 mg | ORAL_TABLET | Freq: Every day | ORAL | 4 refills | Status: DC
Start: 2024-05-21 — End: 2024-06-09

## 2024-05-21 MED ORDER — LAMOTRIGINE ER 50 MG PO TB24: 50.0000 mg | ORAL_TABLET | Freq: Every day | ORAL | 3 refills | Status: DC

## 2024-05-21 NOTE — Progress Notes (Signed)
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   Ht Readings from Last 1 Encounters:  04/17/24 5' 8 (1.727 m)    Physical exam: Exam:  Physical exam: Exam: Gen: NAD, conversant      CV: No palpitations or chest pain or SOB. VS: Breathing at a normal rate. Not febrile. Eyes: Conjunctivae  clear without exudates or hemorrhage  Neuro: Detailed Neurologic Exam  Speech:    Speech is normal; fluent and spontaneous with normal comprehension.  Cognition:    The patient is oriented to person, place, and time;     recent and remote memory intact;     language fluent;     normal attention, concentration, fund of knowledge Cranial Nerves:    The pupils are equal, round, and reactive to light. Visual fields are full Extraocular movements are intact.  The face is symmetric with normal sensation. The palate elevates in the midline. Hearing intact. Voice is normal. Shoulder shrug is normal. The tongue has normal motion without fasciculations.    Coordination: normal  Gait:    No abnormalities noted or reported  Motor Observation:   no involuntary movements noted. Tone:    Appears normal  Posture:    Posture is normal. normal erect    Strength:    Strength is anti-gravity and symmetric in the upper and lower limbs.      Sensation: intact to LT, no reports of numbness or tingling or paresthesias        Assessment/Plan: Patient here for follow up migraines and nystagmus and recent head trauma, CT head negative, still with concussive symptoms not improving, Reviewed videos They also performed testing in the office confirming as below we discussed at length options esp since MRIs, ophthalmology, vestibular rehab completed and still with nystagmus as below:  Head Trauma: concussion ongoing  Physical symptoms Headaches. stable Dizziness and problems with coordination or balance: worse Sensitivity to light or noise: Ligh especially difficult Nausea or vomiting: In general but worse with the vertigo with vomiting Tiredness (fatigue): YES Vision or hearing problems: Yes, nystagmus . Mental and emotional symptoms Irritability or mood changes:.worse Memory problems: Dificult time navigating, visual spatial recognition, word finding, working [employer] concentrating, organizing, or making decisions: yes Changes in eating or sleeping patterns: Difficulty sleeping, insomnia Slowness in thinking, acting or reacting, speaking, or reading: YES Anxiety or depression: worse.   Discussed:  Low dose adderall - continue Aricept  at bedtime can help with cognitive problems temporarily - continue Do not like amitriptyline due to side effects even though it is used post concussion can help with sleep but side effects are worse in my opinion but can discuss with therapist Continue clonazepam  for vertigo prn Lamictal  is good for migraine aura if we consider the vertigo a migraine aura and is a good mood stabilizer - increase  50mg   Article vertigo and vitamin d and calcium provided  Magnesium doses and other OTC medications you can try: 200 mg mag citrate 1-2x daily 400 mg mag oxide 1-2x daily Mag sulfate 600 mg daily  Other vitamin supplementation that may work includes 1000 international units vitamin D3 daily 500 to 1000 mg of B12 daily Riboflavin 400 mg a day 150 to 300 mg co-Q10 daily  Concussion occupational therapy/speech therapy  Exam with dizziness, imbalance, nystagmus,   Concussions usually do not show up on imaging   Migraines are well controlled the vertigo is disabling. She feels like she is moving and nystagmus causing vision changes and episodes f vertigo constantl off balance episodes of frank vertigo several times a day, even moton will trigger. 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  Migraines/aura: try Lamictal   2-3 times a week nystagmus. Fast component is to  the left and has a rotational componentAnd they have depth perception issues with it. Starts acutely. I have reviewed videos and appears the fast component is to the left and their left ear feels full. Also +Dix Hallpike to the left. They have has tried steroids, vestibular therapy, need to send to ENT for thorough examination maybe VNG or ottheirtesting, meniere's disease?  In the past had these episodes with flashing lights does not happen with the migraine pain or prior to migraine headache. No trigger, worse with movement to the left.  MRI brain an orbits normal. NOT associated with migrained ENT REFERRAL:  per patient evaluation was normal  2. Head Trauma:  recent head trauma, CT head negative, still with concussive symptoms but improving,   . Migraines: doing great on ajovy , has been life changing, nurtec helps acutely, will refill. On the Ajovy  not getting many headaches maybe 5 a month and <  10 totla headache days a month prior to Ajovy  had > 20 migraine days a month and daily headaches.     Orders Placed This Encounter  Procedures   Ambulatory referral to Neuropsychology   Meds ordered this encounter  Medications   DISCONTD: amphetamine -dextroamphetamine  (ADDERALL) 10 MG tablet    Sig: Take 1 tablet (10 mg total) by mouth daily with breakfast.    Dispense:  30 tablet    Refill:  0   DISCONTD: lamoTRIgine  (LAMICTAL  XR) 50 MG 24 hour tablet    Sig: Take 1 tablet (50 mg total) by mouth daily.    Dispense:  90 tablet    Refill:  3   donepezil  (ARICEPT ) 5 MG tablet    Sig: Take 1 tablet (5 mg total) by mouth at bedtime.    Dispense:  90 tablet    Refill:  4   amphetamine -dextroamphetamine  (ADDERALL) 10 MG tablet    Sig: Take 1 tablet (10 mg total) by mouth daily with breakfast.    Dispense:  30 tablet    Refill:  0     Cc: Rolinda Millman, MD,  Rolinda Millman, MD  James Epp, MD  [facility] [address] [place], [place] [address]  Phone [phone] Fax [phone]  I spent 36 minutes of face-to-face and non-face-to-face time with patient on the  1. 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 Concussion with loss of consciousness, initial encounter     diagnosis.  This included previsit chart review, lab review, study review, order entry, electronic health record documentation, patient education on the different diagnostic and therapeutic options, counseling and coordination of care, risks and benefits of management, compliance, or risk factor reduction

## 2024-05-22 DIAGNOSIS — F4522 Body dysmorphic disorder: Secondary | ICD-10-CM | POA: Diagnosis not present

## 2024-05-22 DIAGNOSIS — F4389 Other reactions to severe stress: Secondary | ICD-10-CM | POA: Diagnosis not present

## 2024-05-22 DIAGNOSIS — F321 Major depressive disorder, single episode, moderate: Secondary | ICD-10-CM | POA: Diagnosis not present

## 2024-05-22 DIAGNOSIS — F411 Generalized anxiety disorder: Secondary | ICD-10-CM | POA: Diagnosis not present

## 2024-05-26 ENCOUNTER — Telehealth: Payer: Self-pay | Admitting: *Deleted

## 2024-05-26 ENCOUNTER — Encounter: Payer: Self-pay | Admitting: Neurology

## 2024-05-26 ENCOUNTER — Telehealth: Payer: Self-pay | Admitting: Neurology

## 2024-05-26 DIAGNOSIS — F321 Major depressive disorder, single episode, moderate: Secondary | ICD-10-CM | POA: Diagnosis not present

## 2024-05-26 DIAGNOSIS — F411 Generalized anxiety disorder: Secondary | ICD-10-CM | POA: Diagnosis not present

## 2024-05-26 DIAGNOSIS — F4389 Other reactions to severe stress: Secondary | ICD-10-CM | POA: Diagnosis not present

## 2024-05-26 DIAGNOSIS — F4522 Body dysmorphic disorder: Secondary | ICD-10-CM | POA: Diagnosis not present

## 2024-05-26 NOTE — Telephone Encounter (Signed)
 Pt [employer] form was faxed on 05/22/2024

## 2024-05-26 NOTE — Telephone Encounter (Signed)
 [facility] called clarify if referral is for testing or treatment. I Alphonsa) discussed with Dr. Ines, she advised testing and subsequently treatment. Relayed to receptionist for Dr. Claudis office.

## 2024-05-26 NOTE — Telephone Encounter (Signed)
 Referral for neuropsychology fax to [facility]. Phone: [phone], Fax; [phone].

## 2024-05-28 ENCOUNTER — Ambulatory Visit: Admitting: Neurology

## 2024-05-29 DIAGNOSIS — F321 Major depressive disorder, single episode, moderate: Secondary | ICD-10-CM | POA: Diagnosis not present

## 2024-05-29 DIAGNOSIS — F4522 Body dysmorphic disorder: Secondary | ICD-10-CM | POA: Diagnosis not present

## 2024-05-29 DIAGNOSIS — F4389 Other reactions to severe stress: Secondary | ICD-10-CM | POA: Diagnosis not present

## 2024-05-29 DIAGNOSIS — F411 Generalized anxiety disorder: Secondary | ICD-10-CM | POA: Diagnosis not present

## 2024-05-29 NOTE — Telephone Encounter (Signed)
 Received Fax from [facility]  stating : Your Pt has been scheduled  to see Dr. Shila on the following dates  Intake/Testing : August 19,2025 @ 9:00 Am Interview Marthe : September 3,2025 @ 10:00 Am  The report  will be sent to you following  the Feedback Appointment    Requested to fax : 3 last office Notes, H&P/ Medication  list /   **Faxed over to [facility] : 3 last office Notes, H&P/ Medication  list  to [phone]

## 2024-06-02 ENCOUNTER — Telehealth: Payer: Self-pay | Admitting: Neurology

## 2024-06-02 DIAGNOSIS — F411 Generalized anxiety disorder: Secondary | ICD-10-CM | POA: Diagnosis not present

## 2024-06-02 DIAGNOSIS — F4522 Body dysmorphic disorder: Secondary | ICD-10-CM | POA: Diagnosis not present

## 2024-06-02 DIAGNOSIS — F321 Major depressive disorder, single episode, moderate: Secondary | ICD-10-CM | POA: Diagnosis not present

## 2024-06-02 DIAGNOSIS — F4389 Other reactions to severe stress: Secondary | ICD-10-CM | POA: Diagnosis not present

## 2024-06-02 NOTE — Telephone Encounter (Signed)
 Can you squeeze her in somewhere in September for a follow up by video? You can add her on at the end of a day thank you!

## 2024-06-02 NOTE — Telephone Encounter (Signed)
 Spoke with pt and scheduled VV with Dr. Ines for 07/24/24 at 11:30am

## 2024-06-05 ENCOUNTER — Encounter: Payer: Self-pay | Admitting: Neurology

## 2024-06-05 DIAGNOSIS — F321 Major depressive disorder, single episode, moderate: Secondary | ICD-10-CM | POA: Diagnosis not present

## 2024-06-05 DIAGNOSIS — F411 Generalized anxiety disorder: Secondary | ICD-10-CM | POA: Diagnosis not present

## 2024-06-05 DIAGNOSIS — F4389 Other reactions to severe stress: Secondary | ICD-10-CM | POA: Diagnosis not present

## 2024-06-05 DIAGNOSIS — F4522 Body dysmorphic disorder: Secondary | ICD-10-CM | POA: Diagnosis not present

## 2024-06-06 ENCOUNTER — Encounter: Payer: Self-pay | Admitting: Neurology

## 2024-06-09 ENCOUNTER — Telehealth: Payer: BLUE CROSS/BLUE SHIELD | Admitting: Neurology

## 2024-06-09 ENCOUNTER — Other Ambulatory Visit: Payer: Self-pay | Admitting: Neurology

## 2024-06-09 DIAGNOSIS — F4522 Body dysmorphic disorder: Secondary | ICD-10-CM | POA: Diagnosis not present

## 2024-06-09 DIAGNOSIS — F411 Generalized anxiety disorder: Secondary | ICD-10-CM | POA: Diagnosis not present

## 2024-06-09 DIAGNOSIS — F321 Major depressive disorder, single episode, moderate: Secondary | ICD-10-CM | POA: Diagnosis not present

## 2024-06-09 DIAGNOSIS — F4389 Other reactions to severe stress: Secondary | ICD-10-CM | POA: Diagnosis not present

## 2024-06-09 MED ORDER — DONEPEZIL HCL 10 MG PO TABS: 10.0000 mg | ORAL_TABLET | Freq: Every day | ORAL | 3 refills | Status: AC

## 2024-06-12 DIAGNOSIS — F4389 Other reactions to severe stress: Secondary | ICD-10-CM | POA: Diagnosis not present

## 2024-06-12 DIAGNOSIS — F4522 Body dysmorphic disorder: Secondary | ICD-10-CM | POA: Diagnosis not present

## 2024-06-12 DIAGNOSIS — F321 Major depressive disorder, single episode, moderate: Secondary | ICD-10-CM | POA: Diagnosis not present

## 2024-06-12 DIAGNOSIS — F411 Generalized anxiety disorder: Secondary | ICD-10-CM | POA: Diagnosis not present

## 2024-06-16 ENCOUNTER — Encounter: Payer: Self-pay | Admitting: Neurology

## 2024-06-16 DIAGNOSIS — F321 Major depressive disorder, single episode, moderate: Secondary | ICD-10-CM | POA: Diagnosis not present

## 2024-06-16 DIAGNOSIS — F4522 Body dysmorphic disorder: Secondary | ICD-10-CM | POA: Diagnosis not present

## 2024-06-16 DIAGNOSIS — F411 Generalized anxiety disorder: Secondary | ICD-10-CM | POA: Diagnosis not present

## 2024-06-16 DIAGNOSIS — S060X9A Concussion with loss of consciousness of unspecified duration, initial encounter: Secondary | ICD-10-CM

## 2024-06-16 DIAGNOSIS — F4389 Other reactions to severe stress: Secondary | ICD-10-CM | POA: Diagnosis not present

## 2024-06-17 MED ORDER — AMPHETAMINE-DEXTROAMPHETAMINE 10 MG PO TABS: 10.0000 mg | ORAL_TABLET | Freq: Every day | ORAL | 0 refills | Status: AC

## 2024-06-18 NOTE — Telephone Encounter (Signed)
 Pharmacist at [facility] in [place] called to clarify Adderall Rx. His questions were answered. They can fill this time for patient as requested.

## 2024-06-20 DIAGNOSIS — F321 Major depressive disorder, single episode, moderate: Secondary | ICD-10-CM | POA: Diagnosis not present

## 2024-06-20 DIAGNOSIS — F4389 Other reactions to severe stress: Secondary | ICD-10-CM | POA: Diagnosis not present

## 2024-06-20 DIAGNOSIS — F4522 Body dysmorphic disorder: Secondary | ICD-10-CM | POA: Diagnosis not present

## 2024-06-20 DIAGNOSIS — F411 Generalized anxiety disorder: Secondary | ICD-10-CM | POA: Diagnosis not present

## 2024-06-23 DIAGNOSIS — F321 Major depressive disorder, single episode, moderate: Secondary | ICD-10-CM | POA: Diagnosis not present

## 2024-06-23 DIAGNOSIS — F411 Generalized anxiety disorder: Secondary | ICD-10-CM | POA: Diagnosis not present

## 2024-06-23 DIAGNOSIS — F4389 Other reactions to severe stress: Secondary | ICD-10-CM | POA: Diagnosis not present

## 2024-06-23 DIAGNOSIS — F4522 Body dysmorphic disorder: Secondary | ICD-10-CM | POA: Diagnosis not present

## 2024-06-26 DIAGNOSIS — F321 Major depressive disorder, single episode, moderate: Secondary | ICD-10-CM | POA: Diagnosis not present

## 2024-06-26 DIAGNOSIS — F411 Generalized anxiety disorder: Secondary | ICD-10-CM | POA: Diagnosis not present

## 2024-06-26 DIAGNOSIS — F4389 Other reactions to severe stress: Secondary | ICD-10-CM | POA: Diagnosis not present

## 2024-06-26 DIAGNOSIS — F4522 Body dysmorphic disorder: Secondary | ICD-10-CM | POA: Diagnosis not present

## 2024-06-30 ENCOUNTER — Encounter: Payer: Self-pay | Admitting: Neurology

## 2024-06-30 DIAGNOSIS — F4522 Body dysmorphic disorder: Secondary | ICD-10-CM | POA: Diagnosis not present

## 2024-06-30 DIAGNOSIS — F411 Generalized anxiety disorder: Secondary | ICD-10-CM | POA: Diagnosis not present

## 2024-06-30 DIAGNOSIS — F321 Major depressive disorder, single episode, moderate: Secondary | ICD-10-CM | POA: Diagnosis not present

## 2024-06-30 DIAGNOSIS — R42 Dizziness and giddiness: Secondary | ICD-10-CM

## 2024-06-30 DIAGNOSIS — F4389 Other reactions to severe stress: Secondary | ICD-10-CM | POA: Diagnosis not present

## 2024-07-02 MED ORDER — CLONAZEPAM 0.25 MG PO TBDP
0.2500 mg | ORAL_TABLET | Freq: Three times a day (TID) | ORAL | 4 refills | Status: AC | PRN
Start: 2024-07-02 — End: ?

## 2024-07-03 DIAGNOSIS — F4389 Other reactions to severe stress: Secondary | ICD-10-CM | POA: Diagnosis not present

## 2024-07-03 DIAGNOSIS — F321 Major depressive disorder, single episode, moderate: Secondary | ICD-10-CM | POA: Diagnosis not present

## 2024-07-03 DIAGNOSIS — F4522 Body dysmorphic disorder: Secondary | ICD-10-CM | POA: Diagnosis not present

## 2024-07-03 DIAGNOSIS — F411 Generalized anxiety disorder: Secondary | ICD-10-CM | POA: Diagnosis not present

## 2024-07-07 DIAGNOSIS — F4522 Body dysmorphic disorder: Secondary | ICD-10-CM | POA: Diagnosis not present

## 2024-07-07 DIAGNOSIS — G4733 Obstructive sleep apnea (adult) (pediatric): Secondary | ICD-10-CM | POA: Diagnosis not present

## 2024-07-07 DIAGNOSIS — F321 Major depressive disorder, single episode, moderate: Secondary | ICD-10-CM | POA: Diagnosis not present

## 2024-07-07 DIAGNOSIS — R4189 Other symptoms and signs involving cognitive functions and awareness: Secondary | ICD-10-CM | POA: Diagnosis not present

## 2024-07-07 DIAGNOSIS — G4700 Insomnia, unspecified: Secondary | ICD-10-CM | POA: Diagnosis not present

## 2024-07-07 DIAGNOSIS — F4389 Other reactions to severe stress: Secondary | ICD-10-CM | POA: Diagnosis not present

## 2024-07-07 DIAGNOSIS — F411 Generalized anxiety disorder: Secondary | ICD-10-CM | POA: Diagnosis not present

## 2024-07-07 DIAGNOSIS — F515 Nightmare disorder: Secondary | ICD-10-CM | POA: Diagnosis not present

## 2024-07-08 DIAGNOSIS — R4189 Other symptoms and signs involving cognitive functions and awareness: Secondary | ICD-10-CM | POA: Diagnosis not present

## 2024-07-10 DIAGNOSIS — F321 Major depressive disorder, single episode, moderate: Secondary | ICD-10-CM | POA: Diagnosis not present

## 2024-07-10 DIAGNOSIS — F4389 Other reactions to severe stress: Secondary | ICD-10-CM | POA: Diagnosis not present

## 2024-07-10 DIAGNOSIS — F4522 Body dysmorphic disorder: Secondary | ICD-10-CM | POA: Diagnosis not present

## 2024-07-10 DIAGNOSIS — F411 Generalized anxiety disorder: Secondary | ICD-10-CM | POA: Diagnosis not present

## 2024-07-14 DIAGNOSIS — F411 Generalized anxiety disorder: Secondary | ICD-10-CM | POA: Diagnosis not present

## 2024-07-14 DIAGNOSIS — F4389 Other reactions to severe stress: Secondary | ICD-10-CM | POA: Diagnosis not present

## 2024-07-14 DIAGNOSIS — F321 Major depressive disorder, single episode, moderate: Secondary | ICD-10-CM | POA: Diagnosis not present

## 2024-07-14 DIAGNOSIS — F4522 Body dysmorphic disorder: Secondary | ICD-10-CM | POA: Diagnosis not present

## 2024-07-16 DIAGNOSIS — Z79899 Other long term (current) drug therapy: Secondary | ICD-10-CM | POA: Diagnosis not present

## 2024-07-16 DIAGNOSIS — F3341 Major depressive disorder, recurrent, in partial remission: Secondary | ICD-10-CM | POA: Diagnosis not present

## 2024-07-17 ENCOUNTER — Encounter: Payer: Self-pay | Admitting: Neurology

## 2024-07-17 DIAGNOSIS — F321 Major depressive disorder, single episode, moderate: Secondary | ICD-10-CM | POA: Diagnosis not present

## 2024-07-17 DIAGNOSIS — F4389 Other reactions to severe stress: Secondary | ICD-10-CM | POA: Diagnosis not present

## 2024-07-17 DIAGNOSIS — F4522 Body dysmorphic disorder: Secondary | ICD-10-CM | POA: Diagnosis not present

## 2024-07-17 DIAGNOSIS — F411 Generalized anxiety disorder: Secondary | ICD-10-CM | POA: Diagnosis not present

## 2024-07-22 DIAGNOSIS — F4522 Body dysmorphic disorder: Secondary | ICD-10-CM | POA: Diagnosis not present

## 2024-07-22 DIAGNOSIS — F321 Major depressive disorder, single episode, moderate: Secondary | ICD-10-CM | POA: Diagnosis not present

## 2024-07-22 DIAGNOSIS — F4389 Other reactions to severe stress: Secondary | ICD-10-CM | POA: Diagnosis not present

## 2024-07-22 DIAGNOSIS — F411 Generalized anxiety disorder: Secondary | ICD-10-CM | POA: Diagnosis not present

## 2024-07-23 DIAGNOSIS — F4389 Other reactions to severe stress: Secondary | ICD-10-CM | POA: Diagnosis not present

## 2024-07-23 DIAGNOSIS — F451 Undifferentiated somatoform disorder: Secondary | ICD-10-CM | POA: Diagnosis not present

## 2024-07-24 ENCOUNTER — Telehealth: Admitting: Neurology

## 2024-07-24 DIAGNOSIS — F4389 Other reactions to severe stress: Secondary | ICD-10-CM | POA: Diagnosis not present

## 2024-07-24 DIAGNOSIS — F321 Major depressive disorder, single episode, moderate: Secondary | ICD-10-CM | POA: Diagnosis not present

## 2024-07-24 DIAGNOSIS — F411 Generalized anxiety disorder: Secondary | ICD-10-CM | POA: Diagnosis not present

## 2024-07-24 DIAGNOSIS — F4522 Body dysmorphic disorder: Secondary | ICD-10-CM | POA: Diagnosis not present

## 2024-08-04 DIAGNOSIS — F411 Generalized anxiety disorder: Secondary | ICD-10-CM | POA: Diagnosis not present

## 2024-08-04 DIAGNOSIS — F321 Major depressive disorder, single episode, moderate: Secondary | ICD-10-CM | POA: Diagnosis not present

## 2024-08-04 DIAGNOSIS — F4522 Body dysmorphic disorder: Secondary | ICD-10-CM | POA: Diagnosis not present

## 2024-08-04 DIAGNOSIS — F4389 Other reactions to severe stress: Secondary | ICD-10-CM | POA: Diagnosis not present

## 2024-08-06 ENCOUNTER — Encounter: Payer: Self-pay | Admitting: Neurology

## 2024-08-06 DIAGNOSIS — F4389 Other reactions to severe stress: Secondary | ICD-10-CM | POA: Diagnosis not present

## 2024-08-06 DIAGNOSIS — F451 Undifferentiated somatoform disorder: Secondary | ICD-10-CM | POA: Diagnosis not present

## 2024-08-07 DIAGNOSIS — F411 Generalized anxiety disorder: Secondary | ICD-10-CM | POA: Diagnosis not present

## 2024-08-07 DIAGNOSIS — F4522 Body dysmorphic disorder: Secondary | ICD-10-CM | POA: Diagnosis not present

## 2024-08-07 DIAGNOSIS — F321 Major depressive disorder, single episode, moderate: Secondary | ICD-10-CM | POA: Diagnosis not present

## 2024-08-11 DIAGNOSIS — F4522 Body dysmorphic disorder: Secondary | ICD-10-CM | POA: Diagnosis not present

## 2024-08-11 DIAGNOSIS — F321 Major depressive disorder, single episode, moderate: Secondary | ICD-10-CM | POA: Diagnosis not present

## 2024-08-11 DIAGNOSIS — F411 Generalized anxiety disorder: Secondary | ICD-10-CM | POA: Diagnosis not present

## 2024-08-11 DIAGNOSIS — F4389 Other reactions to severe stress: Secondary | ICD-10-CM | POA: Diagnosis not present

## 2024-08-12 DIAGNOSIS — F4389 Other reactions to severe stress: Secondary | ICD-10-CM | POA: Diagnosis not present

## 2024-08-12 DIAGNOSIS — F451 Undifferentiated somatoform disorder: Secondary | ICD-10-CM | POA: Diagnosis not present

## 2024-08-13 ENCOUNTER — Ambulatory Visit: Admitting: Neurology

## 2024-08-14 DIAGNOSIS — F4389 Other reactions to severe stress: Secondary | ICD-10-CM | POA: Diagnosis not present

## 2024-08-14 DIAGNOSIS — F4522 Body dysmorphic disorder: Secondary | ICD-10-CM | POA: Diagnosis not present

## 2024-08-14 DIAGNOSIS — F411 Generalized anxiety disorder: Secondary | ICD-10-CM | POA: Diagnosis not present

## 2024-08-14 DIAGNOSIS — F321 Major depressive disorder, single episode, moderate: Secondary | ICD-10-CM | POA: Diagnosis not present

## 2024-08-15 DIAGNOSIS — Z1159 Encounter for screening for other viral diseases: Secondary | ICD-10-CM | POA: Diagnosis not present

## 2024-08-15 DIAGNOSIS — R413 Other amnesia: Secondary | ICD-10-CM | POA: Diagnosis not present

## 2024-08-15 DIAGNOSIS — R2990 Unspecified symptoms and signs involving the nervous system: Secondary | ICD-10-CM | POA: Diagnosis not present

## 2024-08-15 DIAGNOSIS — Z0000 Encounter for general adult medical examination without abnormal findings: Secondary | ICD-10-CM | POA: Diagnosis not present

## 2024-08-15 DIAGNOSIS — R55 Syncope and collapse: Secondary | ICD-10-CM | POA: Diagnosis not present

## 2024-08-15 DIAGNOSIS — R7301 Impaired fasting glucose: Secondary | ICD-10-CM | POA: Diagnosis not present

## 2024-08-15 DIAGNOSIS — Z13220 Encounter for screening for lipoid disorders: Secondary | ICD-10-CM | POA: Diagnosis not present

## 2024-08-15 DIAGNOSIS — H532 Diplopia: Secondary | ICD-10-CM | POA: Diagnosis not present

## 2024-08-15 DIAGNOSIS — G43801 Other migraine, not intractable, with status migrainosus: Secondary | ICD-10-CM | POA: Diagnosis not present

## 2024-08-15 DIAGNOSIS — J4520 Mild intermittent asthma, uncomplicated: Secondary | ICD-10-CM | POA: Diagnosis not present

## 2024-08-18 DIAGNOSIS — F321 Major depressive disorder, single episode, moderate: Secondary | ICD-10-CM | POA: Diagnosis not present

## 2024-08-18 DIAGNOSIS — F4389 Other reactions to severe stress: Secondary | ICD-10-CM | POA: Diagnosis not present

## 2024-08-18 DIAGNOSIS — F411 Generalized anxiety disorder: Secondary | ICD-10-CM | POA: Diagnosis not present

## 2024-08-18 DIAGNOSIS — F4522 Body dysmorphic disorder: Secondary | ICD-10-CM | POA: Diagnosis not present

## 2024-08-20 ENCOUNTER — Telehealth: Payer: Self-pay | Admitting: Neurology

## 2024-08-20 NOTE — Telephone Encounter (Signed)
 Patient said since Dr. Ines is no longer there would prefer to see a neurologist closer to where I live at [facility].  Patient decided to cancel MyChart Video Visit. Assured patient could be rescheduled with another provider. Patient stated no will find a provider closer to where I live.

## 2024-08-21 DIAGNOSIS — F411 Generalized anxiety disorder: Secondary | ICD-10-CM | POA: Diagnosis not present

## 2024-08-21 DIAGNOSIS — F4389 Other reactions to severe stress: Secondary | ICD-10-CM | POA: Diagnosis not present

## 2024-08-21 DIAGNOSIS — F321 Major depressive disorder, single episode, moderate: Secondary | ICD-10-CM | POA: Diagnosis not present

## 2024-08-21 DIAGNOSIS — F4522 Body dysmorphic disorder: Secondary | ICD-10-CM | POA: Diagnosis not present

## 2024-08-25 DIAGNOSIS — F321 Major depressive disorder, single episode, moderate: Secondary | ICD-10-CM | POA: Diagnosis not present

## 2024-08-25 DIAGNOSIS — F4522 Body dysmorphic disorder: Secondary | ICD-10-CM | POA: Diagnosis not present

## 2024-08-25 DIAGNOSIS — F411 Generalized anxiety disorder: Secondary | ICD-10-CM | POA: Diagnosis not present

## 2024-08-25 DIAGNOSIS — F4389 Other reactions to severe stress: Secondary | ICD-10-CM | POA: Diagnosis not present

## 2024-08-26 DIAGNOSIS — F451 Undifferentiated somatoform disorder: Secondary | ICD-10-CM | POA: Diagnosis not present

## 2024-08-28 DIAGNOSIS — F4522 Body dysmorphic disorder: Secondary | ICD-10-CM | POA: Diagnosis not present

## 2024-08-28 DIAGNOSIS — F411 Generalized anxiety disorder: Secondary | ICD-10-CM | POA: Diagnosis not present

## 2024-08-28 DIAGNOSIS — F321 Major depressive disorder, single episode, moderate: Secondary | ICD-10-CM | POA: Diagnosis not present

## 2024-08-29 ENCOUNTER — Telehealth: Admitting: Neurology

## 2024-09-01 DIAGNOSIS — F411 Generalized anxiety disorder: Secondary | ICD-10-CM | POA: Diagnosis not present

## 2024-09-01 DIAGNOSIS — F4522 Body dysmorphic disorder: Secondary | ICD-10-CM | POA: Diagnosis not present

## 2024-09-01 DIAGNOSIS — F4389 Other reactions to severe stress: Secondary | ICD-10-CM | POA: Diagnosis not present

## 2024-09-01 DIAGNOSIS — F321 Major depressive disorder, single episode, moderate: Secondary | ICD-10-CM | POA: Diagnosis not present

## 2024-09-03 DIAGNOSIS — F451 Undifferentiated somatoform disorder: Secondary | ICD-10-CM | POA: Diagnosis not present

## 2024-09-03 DIAGNOSIS — F4389 Other reactions to severe stress: Secondary | ICD-10-CM | POA: Diagnosis not present

## 2024-09-04 DIAGNOSIS — F321 Major depressive disorder, single episode, moderate: Secondary | ICD-10-CM | POA: Diagnosis not present

## 2024-09-04 DIAGNOSIS — F4522 Body dysmorphic disorder: Secondary | ICD-10-CM | POA: Diagnosis not present

## 2024-09-04 DIAGNOSIS — F411 Generalized anxiety disorder: Secondary | ICD-10-CM | POA: Diagnosis not present

## 2024-09-04 DIAGNOSIS — F4389 Other reactions to severe stress: Secondary | ICD-10-CM | POA: Diagnosis not present

## 2024-09-08 DIAGNOSIS — F4389 Other reactions to severe stress: Secondary | ICD-10-CM | POA: Diagnosis not present

## 2024-09-08 DIAGNOSIS — F4522 Body dysmorphic disorder: Secondary | ICD-10-CM | POA: Diagnosis not present

## 2024-09-08 DIAGNOSIS — F321 Major depressive disorder, single episode, moderate: Secondary | ICD-10-CM | POA: Diagnosis not present

## 2024-09-08 DIAGNOSIS — F411 Generalized anxiety disorder: Secondary | ICD-10-CM | POA: Diagnosis not present

## 2024-09-10 DIAGNOSIS — F4389 Other reactions to severe stress: Secondary | ICD-10-CM | POA: Diagnosis not present

## 2024-09-10 DIAGNOSIS — F451 Undifferentiated somatoform disorder: Secondary | ICD-10-CM | POA: Diagnosis not present

## 2024-09-11 DIAGNOSIS — F4522 Body dysmorphic disorder: Secondary | ICD-10-CM | POA: Diagnosis not present

## 2024-09-11 DIAGNOSIS — F321 Major depressive disorder, single episode, moderate: Secondary | ICD-10-CM | POA: Diagnosis not present

## 2024-09-11 DIAGNOSIS — F411 Generalized anxiety disorder: Secondary | ICD-10-CM | POA: Diagnosis not present

## 2024-09-11 DIAGNOSIS — F4389 Other reactions to severe stress: Secondary | ICD-10-CM | POA: Diagnosis not present

## 2024-09-15 DIAGNOSIS — F4389 Other reactions to severe stress: Secondary | ICD-10-CM | POA: Diagnosis not present

## 2024-09-15 DIAGNOSIS — F321 Major depressive disorder, single episode, moderate: Secondary | ICD-10-CM | POA: Diagnosis not present

## 2024-09-15 DIAGNOSIS — F411 Generalized anxiety disorder: Secondary | ICD-10-CM | POA: Diagnosis not present

## 2024-09-15 DIAGNOSIS — F4522 Body dysmorphic disorder: Secondary | ICD-10-CM | POA: Diagnosis not present

## 2024-09-17 DIAGNOSIS — F4389 Other reactions to severe stress: Secondary | ICD-10-CM | POA: Diagnosis not present

## 2024-09-17 DIAGNOSIS — F451 Undifferentiated somatoform disorder: Secondary | ICD-10-CM | POA: Diagnosis not present

## 2024-09-18 DIAGNOSIS — F4522 Body dysmorphic disorder: Secondary | ICD-10-CM | POA: Diagnosis not present

## 2024-09-18 DIAGNOSIS — F411 Generalized anxiety disorder: Secondary | ICD-10-CM | POA: Diagnosis not present

## 2024-09-18 DIAGNOSIS — F4389 Other reactions to severe stress: Secondary | ICD-10-CM | POA: Diagnosis not present

## 2024-09-18 DIAGNOSIS — F321 Major depressive disorder, single episode, moderate: Secondary | ICD-10-CM | POA: Diagnosis not present

## 2024-09-22 DIAGNOSIS — F4522 Body dysmorphic disorder: Secondary | ICD-10-CM | POA: Diagnosis not present

## 2024-09-22 DIAGNOSIS — F321 Major depressive disorder, single episode, moderate: Secondary | ICD-10-CM | POA: Diagnosis not present

## 2024-09-22 DIAGNOSIS — F411 Generalized anxiety disorder: Secondary | ICD-10-CM | POA: Diagnosis not present

## 2024-09-22 DIAGNOSIS — F4389 Other reactions to severe stress: Secondary | ICD-10-CM | POA: Diagnosis not present

## 2024-09-25 DIAGNOSIS — F451 Undifferentiated somatoform disorder: Secondary | ICD-10-CM | POA: Diagnosis not present

## 2024-09-25 DIAGNOSIS — F4389 Other reactions to severe stress: Secondary | ICD-10-CM | POA: Diagnosis not present

## 2024-09-26 DIAGNOSIS — F411 Generalized anxiety disorder: Secondary | ICD-10-CM | POA: Diagnosis not present

## 2024-09-26 DIAGNOSIS — F4522 Body dysmorphic disorder: Secondary | ICD-10-CM | POA: Diagnosis not present

## 2024-09-26 DIAGNOSIS — F4389 Other reactions to severe stress: Secondary | ICD-10-CM | POA: Diagnosis not present

## 2024-09-26 DIAGNOSIS — F321 Major depressive disorder, single episode, moderate: Secondary | ICD-10-CM | POA: Diagnosis not present

## 2024-09-29 DIAGNOSIS — F411 Generalized anxiety disorder: Secondary | ICD-10-CM | POA: Diagnosis not present

## 2024-09-29 DIAGNOSIS — F4522 Body dysmorphic disorder: Secondary | ICD-10-CM | POA: Diagnosis not present

## 2024-09-29 DIAGNOSIS — F4389 Other reactions to severe stress: Secondary | ICD-10-CM | POA: Diagnosis not present

## 2024-09-29 DIAGNOSIS — F321 Major depressive disorder, single episode, moderate: Secondary | ICD-10-CM | POA: Diagnosis not present

## 2024-10-02 DIAGNOSIS — F4522 Body dysmorphic disorder: Secondary | ICD-10-CM | POA: Diagnosis not present

## 2024-10-02 DIAGNOSIS — F4389 Other reactions to severe stress: Secondary | ICD-10-CM | POA: Diagnosis not present

## 2024-10-02 DIAGNOSIS — F321 Major depressive disorder, single episode, moderate: Secondary | ICD-10-CM | POA: Diagnosis not present

## 2024-10-02 DIAGNOSIS — F411 Generalized anxiety disorder: Secondary | ICD-10-CM | POA: Diagnosis not present

## 2024-10-06 DIAGNOSIS — F411 Generalized anxiety disorder: Secondary | ICD-10-CM | POA: Diagnosis not present

## 2024-10-06 DIAGNOSIS — F321 Major depressive disorder, single episode, moderate: Secondary | ICD-10-CM | POA: Diagnosis not present

## 2024-10-06 DIAGNOSIS — F4389 Other reactions to severe stress: Secondary | ICD-10-CM | POA: Diagnosis not present

## 2024-10-06 DIAGNOSIS — F4522 Body dysmorphic disorder: Secondary | ICD-10-CM | POA: Diagnosis not present

## 2024-10-08 DIAGNOSIS — F4389 Other reactions to severe stress: Secondary | ICD-10-CM | POA: Diagnosis not present

## 2024-10-08 DIAGNOSIS — F451 Undifferentiated somatoform disorder: Secondary | ICD-10-CM | POA: Diagnosis not present

## 2024-10-09 DIAGNOSIS — F4389 Other reactions to severe stress: Secondary | ICD-10-CM | POA: Diagnosis not present

## 2024-10-09 DIAGNOSIS — F411 Generalized anxiety disorder: Secondary | ICD-10-CM | POA: Diagnosis not present

## 2024-10-09 DIAGNOSIS — F321 Major depressive disorder, single episode, moderate: Secondary | ICD-10-CM | POA: Diagnosis not present

## 2024-10-09 DIAGNOSIS — F4522 Body dysmorphic disorder: Secondary | ICD-10-CM | POA: Diagnosis not present

## 2024-10-13 DIAGNOSIS — F4389 Other reactions to severe stress: Secondary | ICD-10-CM | POA: Diagnosis not present

## 2024-10-13 DIAGNOSIS — F4522 Body dysmorphic disorder: Secondary | ICD-10-CM | POA: Diagnosis not present

## 2024-10-13 DIAGNOSIS — F411 Generalized anxiety disorder: Secondary | ICD-10-CM | POA: Diagnosis not present

## 2024-10-13 DIAGNOSIS — F321 Major depressive disorder, single episode, moderate: Secondary | ICD-10-CM | POA: Diagnosis not present

## 2024-10-15 DIAGNOSIS — F411 Generalized anxiety disorder: Secondary | ICD-10-CM | POA: Diagnosis not present

## 2024-10-15 DIAGNOSIS — F321 Major depressive disorder, single episode, moderate: Secondary | ICD-10-CM | POA: Diagnosis not present

## 2024-10-15 DIAGNOSIS — F4522 Body dysmorphic disorder: Secondary | ICD-10-CM | POA: Diagnosis not present

## 2024-10-15 DIAGNOSIS — F4389 Other reactions to severe stress: Secondary | ICD-10-CM | POA: Diagnosis not present

## 2024-10-20 DIAGNOSIS — F4389 Other reactions to severe stress: Secondary | ICD-10-CM | POA: Diagnosis not present

## 2024-10-20 DIAGNOSIS — F4522 Body dysmorphic disorder: Secondary | ICD-10-CM | POA: Diagnosis not present

## 2024-10-20 DIAGNOSIS — F411 Generalized anxiety disorder: Secondary | ICD-10-CM | POA: Diagnosis not present

## 2024-10-20 DIAGNOSIS — F321 Major depressive disorder, single episode, moderate: Secondary | ICD-10-CM | POA: Diagnosis not present

## 2024-10-22 DIAGNOSIS — F4522 Body dysmorphic disorder: Secondary | ICD-10-CM | POA: Diagnosis not present

## 2024-10-22 DIAGNOSIS — F321 Major depressive disorder, single episode, moderate: Secondary | ICD-10-CM | POA: Diagnosis not present

## 2024-10-22 DIAGNOSIS — F411 Generalized anxiety disorder: Secondary | ICD-10-CM | POA: Diagnosis not present

## 2024-10-22 DIAGNOSIS — F4389 Other reactions to severe stress: Secondary | ICD-10-CM | POA: Diagnosis not present

## 2024-10-27 DIAGNOSIS — F411 Generalized anxiety disorder: Secondary | ICD-10-CM | POA: Diagnosis not present

## 2024-10-27 DIAGNOSIS — F4522 Body dysmorphic disorder: Secondary | ICD-10-CM | POA: Diagnosis not present

## 2024-10-27 DIAGNOSIS — F321 Major depressive disorder, single episode, moderate: Secondary | ICD-10-CM | POA: Diagnosis not present

## 2024-10-27 DIAGNOSIS — F4389 Other reactions to severe stress: Secondary | ICD-10-CM | POA: Diagnosis not present

## 2024-10-30 DIAGNOSIS — F321 Major depressive disorder, single episode, moderate: Secondary | ICD-10-CM | POA: Diagnosis not present

## 2024-10-30 DIAGNOSIS — F4389 Other reactions to severe stress: Secondary | ICD-10-CM | POA: Diagnosis not present

## 2024-10-30 DIAGNOSIS — F4522 Body dysmorphic disorder: Secondary | ICD-10-CM | POA: Diagnosis not present

## 2024-10-30 DIAGNOSIS — F411 Generalized anxiety disorder: Secondary | ICD-10-CM | POA: Diagnosis not present

## 2024-11-03 DIAGNOSIS — F4522 Body dysmorphic disorder: Secondary | ICD-10-CM | POA: Diagnosis not present

## 2024-11-03 DIAGNOSIS — F411 Generalized anxiety disorder: Secondary | ICD-10-CM | POA: Diagnosis not present

## 2024-11-03 DIAGNOSIS — F4389 Other reactions to severe stress: Secondary | ICD-10-CM | POA: Diagnosis not present

## 2024-11-03 DIAGNOSIS — F321 Major depressive disorder, single episode, moderate: Secondary | ICD-10-CM | POA: Diagnosis not present
